# Patient Record
Sex: Female | Born: 1955 | Race: White | Hispanic: No | Marital: Single | Smoking: Current every day smoker
Health system: Southern US, Community
[De-identification: ages and names within clinical notes are randomized; demographics above are authoritative.]

## PROBLEM LIST (undated history)

## (undated) DIAGNOSIS — F410 Panic disorder [episodic paroxysmal anxiety] without agoraphobia: Secondary | ICD-10-CM

## (undated) DIAGNOSIS — D509 Iron deficiency anemia, unspecified: Secondary | ICD-10-CM

## (undated) DIAGNOSIS — E785 Hyperlipidemia, unspecified: Secondary | ICD-10-CM

## (undated) DIAGNOSIS — K219 Gastro-esophageal reflux disease without esophagitis: Secondary | ICD-10-CM

## (undated) DIAGNOSIS — I10 Essential (primary) hypertension: Secondary | ICD-10-CM

## (undated) DIAGNOSIS — K589 Irritable bowel syndrome without diarrhea: Secondary | ICD-10-CM

## (undated) HISTORY — DX: Essential (primary) hypertension: I10

## (undated) HISTORY — DX: Hyperlipidemia, unspecified: E78.5

## (undated) HISTORY — DX: Panic disorder (episodic paroxysmal anxiety): F41.0

## (undated) HISTORY — DX: Gastro-esophageal reflux disease without esophagitis: K21.9

## (undated) HISTORY — PX: BREAST SURGERY: SHX581

## (undated) HISTORY — DX: Iron deficiency anemia, unspecified: D50.9

## (undated) HISTORY — PX: BREAST EXCISIONAL BIOPSY: SUR124

## (undated) HISTORY — DX: Irritable bowel syndrome, unspecified: K58.9

---

## 1998-06-19 ENCOUNTER — Other Ambulatory Visit: Admission: RE | Admit: 1998-06-19 | Discharge: 1998-06-19 | Payer: Self-pay | Admitting: *Deleted

## 1999-04-29 HISTORY — PX: COLONOSCOPY: SHX174

## 1999-10-08 ENCOUNTER — Other Ambulatory Visit: Admission: RE | Admit: 1999-10-08 | Discharge: 1999-10-08 | Payer: Self-pay | Admitting: *Deleted

## 2000-10-12 ENCOUNTER — Other Ambulatory Visit: Admission: RE | Admit: 2000-10-12 | Discharge: 2000-10-12 | Payer: Self-pay | Admitting: *Deleted

## 2001-02-07 ENCOUNTER — Encounter: Admission: RE | Admit: 2001-02-07 | Discharge: 2001-02-07 | Payer: Self-pay | Admitting: *Deleted

## 2001-11-02 ENCOUNTER — Other Ambulatory Visit: Admission: RE | Admit: 2001-11-02 | Discharge: 2001-11-02 | Payer: Self-pay | Admitting: *Deleted

## 2001-12-25 ENCOUNTER — Other Ambulatory Visit: Admission: RE | Admit: 2001-12-25 | Discharge: 2001-12-25 | Payer: Self-pay | Admitting: Obstetrics and Gynecology

## 2002-07-26 ENCOUNTER — Encounter: Admission: RE | Admit: 2002-07-26 | Discharge: 2002-07-26 | Payer: Self-pay | Admitting: *Deleted

## 2002-09-10 ENCOUNTER — Other Ambulatory Visit: Admission: RE | Admit: 2002-09-10 | Discharge: 2002-09-10 | Payer: Self-pay | Admitting: *Deleted

## 2003-02-15 ENCOUNTER — Other Ambulatory Visit: Admission: RE | Admit: 2003-02-15 | Discharge: 2003-02-15 | Payer: Self-pay | Admitting: *Deleted

## 2004-09-18 ENCOUNTER — Ambulatory Visit (HOSPITAL_COMMUNITY): Admission: RE | Admit: 2004-09-18 | Discharge: 2004-09-18 | Payer: Self-pay | Admitting: *Deleted

## 2005-10-08 ENCOUNTER — Ambulatory Visit (HOSPITAL_COMMUNITY): Admission: RE | Admit: 2005-10-08 | Discharge: 2005-10-08 | Payer: Self-pay | Admitting: *Deleted

## 2006-07-23 ENCOUNTER — Emergency Department (HOSPITAL_COMMUNITY): Admission: EM | Admit: 2006-07-23 | Discharge: 2006-07-23 | Payer: Self-pay | Admitting: *Deleted

## 2006-11-03 ENCOUNTER — Ambulatory Visit (HOSPITAL_COMMUNITY): Admission: RE | Admit: 2006-11-03 | Discharge: 2006-11-03 | Payer: Self-pay | Admitting: Obstetrics

## 2008-12-24 ENCOUNTER — Encounter (HOSPITAL_COMMUNITY): Admission: RE | Admit: 2008-12-24 | Discharge: 2009-01-14 | Payer: Self-pay | Admitting: Preventative Medicine

## 2009-10-20 ENCOUNTER — Ambulatory Visit (HOSPITAL_COMMUNITY): Admission: RE | Admit: 2009-10-20 | Discharge: 2009-10-20 | Payer: Self-pay | Admitting: Internal Medicine

## 2010-02-11 ENCOUNTER — Encounter
Admission: RE | Admit: 2010-02-11 | Discharge: 2010-02-11 | Payer: Self-pay | Source: Home / Self Care | Attending: Physical Medicine and Rehabilitation | Admitting: Physical Medicine and Rehabilitation

## 2010-10-21 ENCOUNTER — Other Ambulatory Visit (HOSPITAL_COMMUNITY): Payer: Self-pay | Admitting: Obstetrics

## 2010-10-21 DIAGNOSIS — Z1231 Encounter for screening mammogram for malignant neoplasm of breast: Secondary | ICD-10-CM

## 2010-11-02 ENCOUNTER — Ambulatory Visit (HOSPITAL_COMMUNITY): Payer: BC Managed Care – PPO

## 2011-06-18 ENCOUNTER — Encounter: Payer: BC Managed Care – PPO | Admitting: Internal Medicine

## 2011-06-18 DIAGNOSIS — D473 Essential (hemorrhagic) thrombocythemia: Secondary | ICD-10-CM

## 2011-06-18 DIAGNOSIS — D649 Anemia, unspecified: Secondary | ICD-10-CM

## 2011-06-18 DIAGNOSIS — M129 Arthropathy, unspecified: Secondary | ICD-10-CM

## 2011-07-07 IMAGING — CR DG CERVICAL SPINE COMPLETE 4+V
5 series · 5 of 5 positions shown · non-contrast
Comparison: None.

CLINICAL DATA: Neck pain.

CERVICAL SPINE - COMPLETE 4+ VIEW

[w c-spine lat]
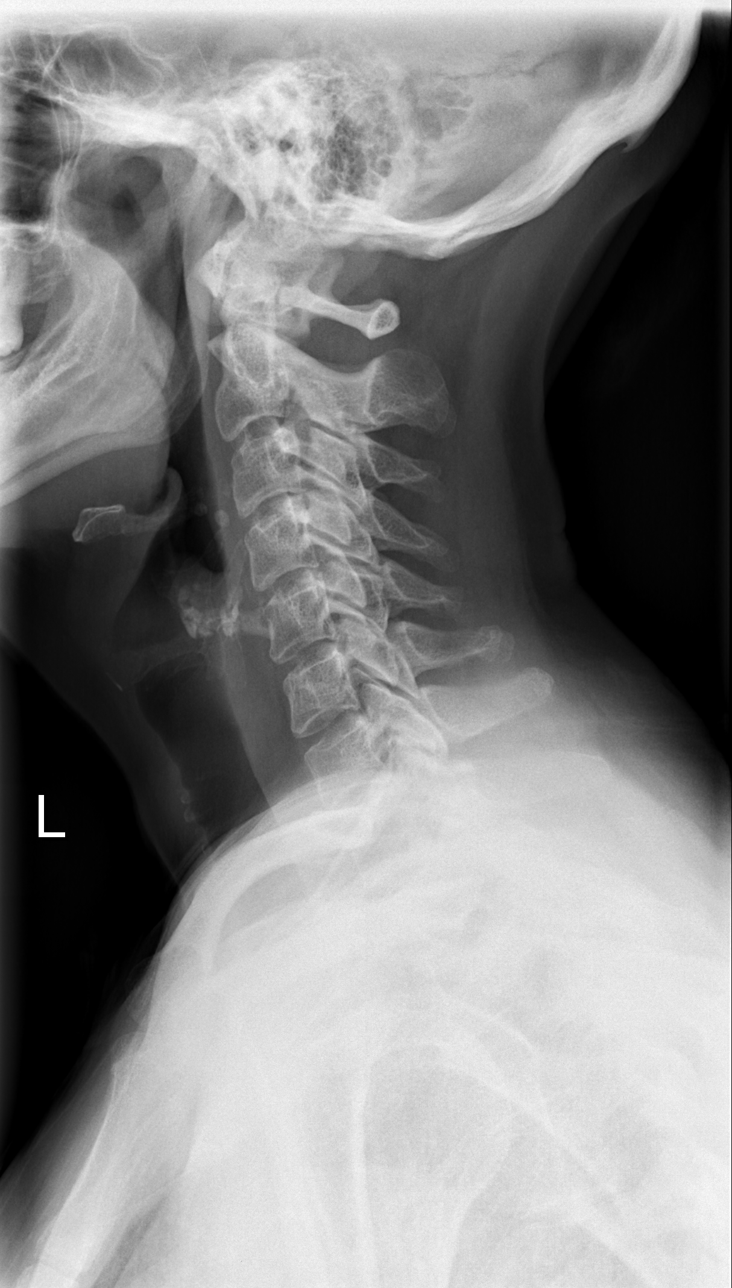

[w c-spine oblique (1 of 2)]
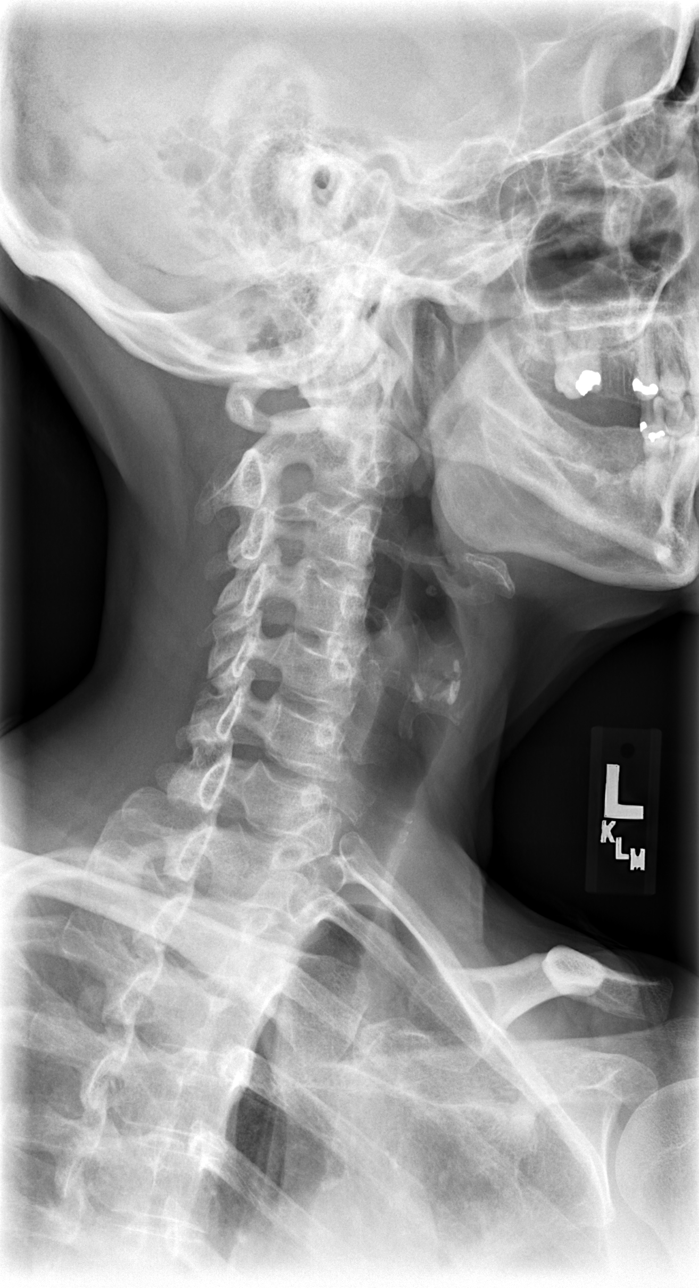

[w c-spine oblique (2 of 2)]
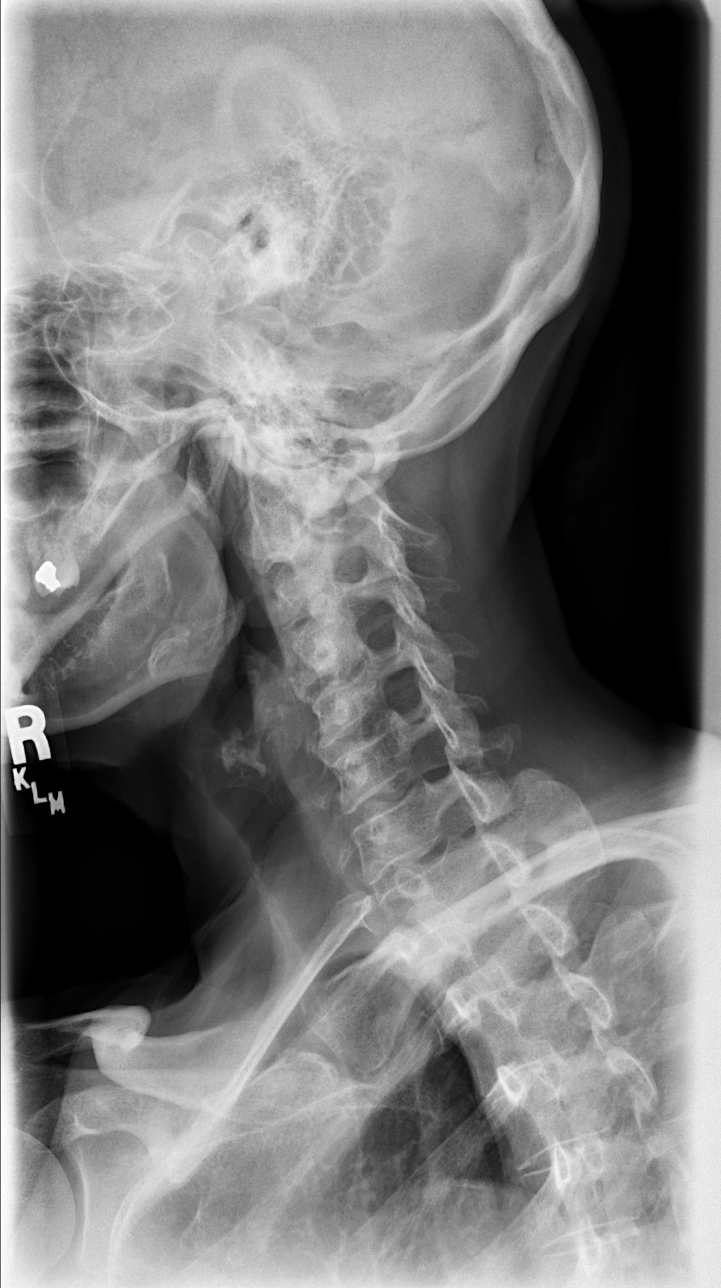

[w c-spine a.p. *]
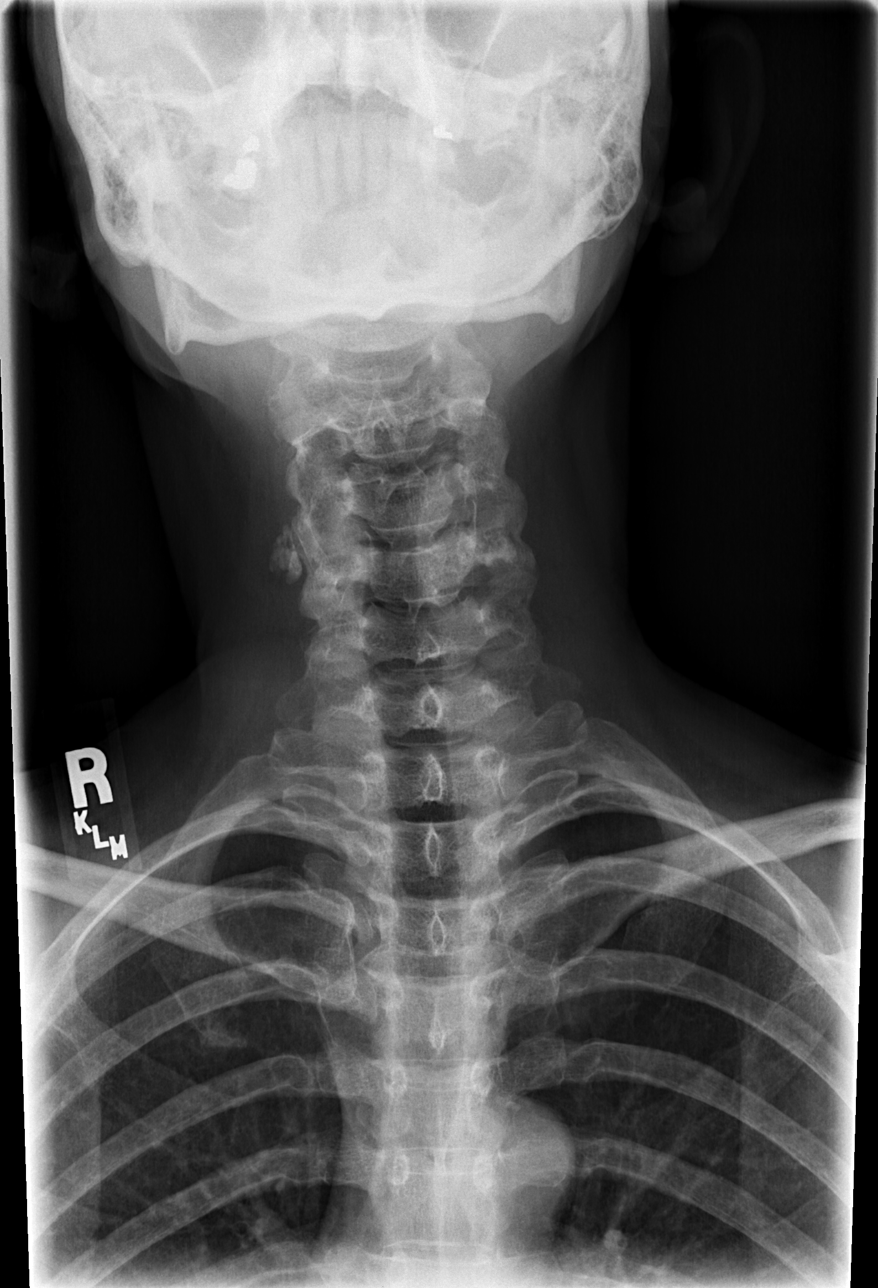

[w c-spine odontoid *]
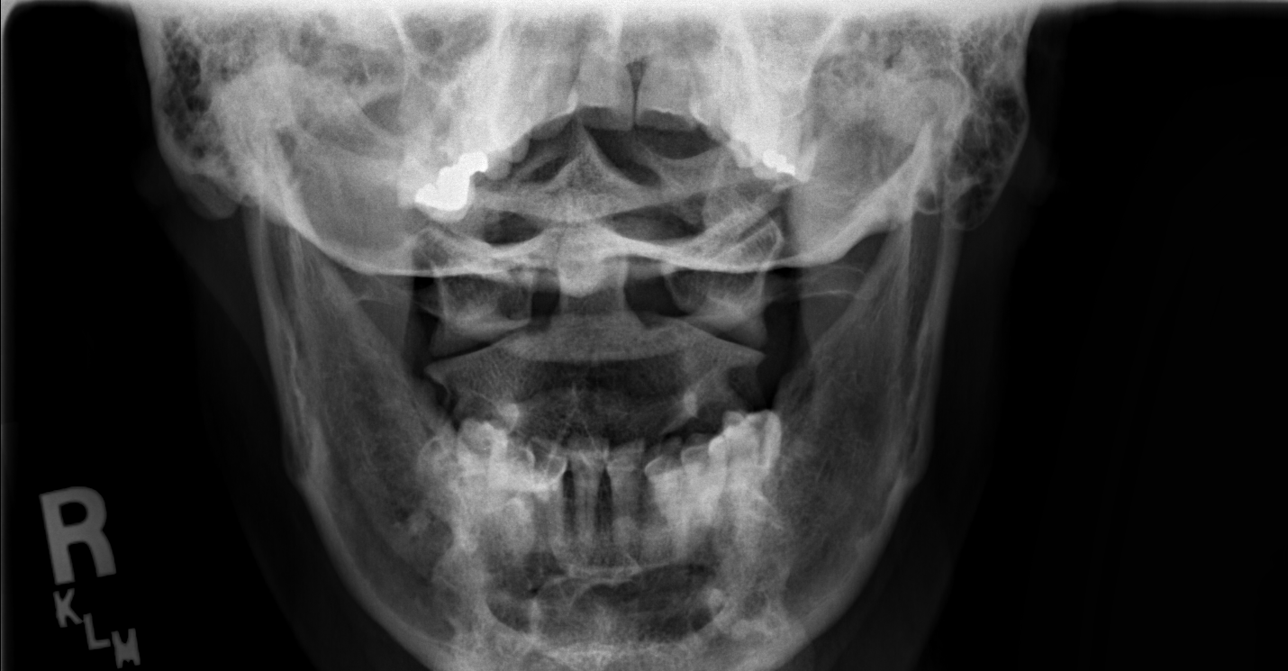

[5 of 5 positions shown; findings below may reference images not displayed]

FINDINGS: No evidence for fracture.  No subluxation.  Mild loss of
disc height is seen at C6-7.  The facets are well-aligned
bilaterally.  There is no bony foraminal encroachment on either
side.  No prevertebral soft tissue swelling.  Prominent right
jugular calcification is evident.
IMPRESSION: Mild loss of disc height at C6-7.  Otherwise unremarkable cervical
spine exam.

## 2011-07-23 ENCOUNTER — Encounter: Payer: BC Managed Care – PPO | Admitting: Internal Medicine

## 2011-07-23 DIAGNOSIS — R5383 Other fatigue: Secondary | ICD-10-CM

## 2011-07-23 DIAGNOSIS — D473 Essential (hemorrhagic) thrombocythemia: Secondary | ICD-10-CM

## 2011-07-23 DIAGNOSIS — R5381 Other malaise: Secondary | ICD-10-CM

## 2011-10-23 ENCOUNTER — Emergency Department (HOSPITAL_COMMUNITY)
Admission: EM | Admit: 2011-10-23 | Discharge: 2011-10-23 | Disposition: A | Discharge status: Home / Self Care | Payer: BC Managed Care – PPO | Attending: Emergency Medicine | Admitting: Emergency Medicine

## 2011-10-23 ENCOUNTER — Emergency Department (HOSPITAL_COMMUNITY): Payer: BC Managed Care – PPO

## 2011-10-23 ENCOUNTER — Encounter (HOSPITAL_COMMUNITY): Payer: Self-pay | Admitting: *Deleted

## 2011-10-23 DIAGNOSIS — R197 Diarrhea, unspecified: Secondary | ICD-10-CM | POA: Insufficient documentation

## 2011-10-23 DIAGNOSIS — R110 Nausea: Secondary | ICD-10-CM | POA: Insufficient documentation

## 2011-10-23 DIAGNOSIS — R059 Cough, unspecified: Secondary | ICD-10-CM | POA: Insufficient documentation

## 2011-10-23 DIAGNOSIS — Z79899 Other long term (current) drug therapy: Secondary | ICD-10-CM | POA: Insufficient documentation

## 2011-10-23 DIAGNOSIS — R5383 Other fatigue: Secondary | ICD-10-CM | POA: Insufficient documentation

## 2011-10-23 DIAGNOSIS — R51 Headache: Secondary | ICD-10-CM | POA: Insufficient documentation

## 2011-10-23 DIAGNOSIS — J3489 Other specified disorders of nose and nasal sinuses: Secondary | ICD-10-CM | POA: Insufficient documentation

## 2011-10-23 DIAGNOSIS — I10 Essential (primary) hypertension: Secondary | ICD-10-CM | POA: Insufficient documentation

## 2011-10-23 DIAGNOSIS — R05 Cough: Secondary | ICD-10-CM | POA: Insufficient documentation

## 2011-10-23 DIAGNOSIS — R5381 Other malaise: Secondary | ICD-10-CM | POA: Insufficient documentation

## 2011-10-23 HISTORY — DX: Essential (primary) hypertension: I10

## 2011-10-23 MED ORDER — HYDROCODONE-ACETAMINOPHEN 5-500 MG PO TABS: 1.0000 | ORAL_TABLET | Freq: Four times a day (QID) | ORAL | Status: DC | PRN

## 2011-10-23 MED ORDER — KETOROLAC TROMETHAMINE 60 MG/2ML IM SOLN
60.0000 mg | Freq: Once | INTRAMUSCULAR | Status: AC
  Administered 2011-10-23: 60 mg via INTRAMUSCULAR
  Filled 2011-10-23: qty 2

## 2011-10-23 MED ORDER — HYDROCODONE-ACETAMINOPHEN 5-325 MG PO TABS
2.0000 | ORAL_TABLET | Freq: Once | ORAL | Status: AC
  Administered 2011-10-23: 2 via ORAL
  Filled 2011-10-23: qty 2

## 2011-10-23 NOTE — ED Notes (Signed)
Pt discharged. Pt stable at time of discharge. Medications reviewed pt has no questions regarding discharge at this time. Pt voiced understanding of discharge instructions.  

## 2011-10-23 NOTE — ED Provider Notes (Signed)
History   This chart was scribed for Pam Lyons, MD scribed by Magnus Sinning. The patient was seen in room APA18/APA18 at 21:19.   CSN: [id number]  Arrival date & time 10/23/11  2042   Chief Complaint  Patient presents with  . Nasal Congestion  . Weakness  . Diarrhea  . Headache  . Nausea    (Consider location/radiation/quality/duration/timing/severity/associated sxs/prior treatment) The history is provided by the patient. No language interpreter was used.   ROE KOFFMAN is a 56 y.o. female who presents to the Emergency Department complaining of gradually improving HA, onset this morning with associated weakness, mild cough, congestion, nausea, and clammy skin. Patient says she woke up this morning with a severe HA. She says it has since gradually improved throughout the day, but explains it still persists. She says she was treated last week for a sinus infection, but she says it has since resolved. She denies fever,neck pain, head injury, pulmonary conditions, cardiac conditions, or abd surgeries. Patient currently taking Oxycodone for treatment of back pain and medications for GERD.   Past Medical History  Diagnosis Date  . Hypertension     Past Surgical History  Procedure Date  . Breast surgery     biopsy    Family History  Problem Relation Age of Onset  . Cancer Mother   . Cancer Father   . Hypertension Father   . Cancer Sister     History  Substance Use Topics  . Smoking status: Current Every Day Smoker  . Smokeless tobacco: Not on file  . Alcohol Use: Yes     occ   Review of Systems 10 Systems reviewed and are negative for acute change except as noted in the HPI. Allergies  Review of patient's allergies indicates no known allergies.  Home Medications   Current Outpatient Rx  Name Route Sig Dispense Refill  . ESOMEPRAZOLE MAGNESIUM 20 MG PO CPDR Oral Take 20 mg by mouth daily before breakfast.      BP 164/103  Pulse 111  Temp 97.9 F (36.6 C)  (Oral)  Resp 20  Ht 5\' 4"  (1.626 m)  Wt 110 lb (49.896 kg)  BMI 18.88 kg/m2  SpO2 100%  Physical Exam  Nursing note and vitals reviewed. Constitutional: She is oriented to person, place, and time. She appears well-developed and well-nourished. No distress.  HENT:  Head: Normocephalic and atraumatic.  Eyes: Conjunctivae normal and EOM are normal. Pupils are equal, round, and reactive to light.  Neck: Normal range of motion. Neck supple. No tracheal deviation present.  Cardiovascular: Normal rate and regular rhythm.  Exam reveals no friction rub.   No murmur heard. Pulmonary/Chest: Effort normal. No respiratory distress.  Abdominal: Soft. She exhibits no distension. There is no tenderness.  Musculoskeletal: Normal range of motion. She exhibits no edema.  Lymphadenopathy:    She has no cervical adenopathy.  Neurological: She is alert and oriented to person, place, and time. No cranial nerve deficit or sensory deficit.  Skin: Skin is warm and dry.  Psychiatric: She has a normal mood and affect. Her behavior is normal.    ED Course  Procedures (including critical care time) DIAGNOSTIC STUDIES: Oxygen Saturation is 100% on room air, normal by my interpretation.    COORDINATION OF CARE: 21:21: Physical exam performed.  Labs Reviewed - No data to display No results found.   No diagnosis found.    MDM  The patient presents here with multiple symptoms that all seem to have started  over the past few days.  These include congestion, diarrhea, weakness, and today began with headache that kept her from going to work.  I am uncertain as to how these symptoms are related, but I suspect there is likely a viral etiology.  A ct of the head was performed which was negative.  At this point, she remains neurologically intact and is feeling better with the medications she was given.  I see no indication for an LP or further workup.  She will be discharged to home.   I personally performed the  services described in this documentation, which was scribed in my presence. The recorded information has been reviewed and considered.           Pam Lyons, MD 10/23/11 [phone]

## 2011-10-23 NOTE — ED Notes (Signed)
Pt c/o nausea, congestion, headache, weakness, and diarrhea. Pt recently treated for sinus infection with a z pack.

## 2012-12-07 ENCOUNTER — Telehealth: Payer: Self-pay

## 2012-12-13 NOTE — Telephone Encounter (Signed)
LMOM to call . Her last one was 04/29/1999 by Dr. Jena Gauss at [facility] and she had a normal colon.

## 2012-12-28 NOTE — Telephone Encounter (Signed)
Pt has OV with Tana Coast, PA on 01/09/2013 at 9:30 AM for diarrhea and schedule colonoscopy.

## 2013-01-09 ENCOUNTER — Ambulatory Visit: Payer: BC Managed Care – PPO | Admitting: Gastroenterology

## 2013-02-07 ENCOUNTER — Other Ambulatory Visit: Payer: Self-pay | Admitting: Internal Medicine

## 2013-02-07 ENCOUNTER — Ambulatory Visit (INDEPENDENT_AMBULATORY_CARE_PROVIDER_SITE_OTHER): Payer: PRIVATE HEALTH INSURANCE | Admitting: Gastroenterology

## 2013-02-07 ENCOUNTER — Encounter (INDEPENDENT_AMBULATORY_CARE_PROVIDER_SITE_OTHER): Payer: Self-pay

## 2013-02-07 ENCOUNTER — Encounter: Payer: Self-pay | Admitting: Gastroenterology

## 2013-02-07 VITALS — BP 159/91 | HR 88 | Temp 98.3°F | Wt 106.2 lb

## 2013-02-07 DIAGNOSIS — R634 Abnormal weight loss: Secondary | ICD-10-CM

## 2013-02-07 DIAGNOSIS — R197 Diarrhea, unspecified: Secondary | ICD-10-CM

## 2013-02-07 DIAGNOSIS — K529 Noninfective gastroenteritis and colitis, unspecified: Secondary | ICD-10-CM

## 2013-02-07 MED ORDER — PEG 3350-KCL-NA BICARB-NACL 420 G PO SOLR: 4000.0000 mL | ORAL | Status: DC

## 2013-02-07 NOTE — Patient Instructions (Addendum)
1. Colonoscopy as planned. See separate instructions.  2. You may try imodium 2mg  every morning. Increase up to three times daily if needed. 3. Due to your weight loss, let's go ahead and check labs for celiac and stool specimen to see if you pancreas is working properly.

## 2013-02-07 NOTE — Assessment & Plan Note (Signed)
58 year old lady with one-year history of chronic diarrhea, 10+ pound weight loss. Differential diagnosis includes irritable bowel syndrome, microscopic colitis, celiac disease, pancreatic insufficiency. Doubt we are dealing with IBD. Recommend colonoscopy for further evaluation. Given history of an inadequate conscious sedation, I offered her each sedation in the OR but she preferred not to go this route. We will augment conscious sedation with Phenergan 25 mg IV 30 minutes before the procedure.  I have discussed the risks, alternatives, benefits with regards to but not limited to the risk of reaction to medication, bleeding, infection, perforation and the patient is agreeable to proceed. Written consent to be obtained.  Trial of Imodium 2 mg daily. May take up to 3 times daily as needed. Check celiac serologies. Stool elastase.

## 2013-02-07 NOTE — Progress Notes (Signed)
Primary Care Physician:  Glenda Chroman., MD Primary Gastroenterologist:  Garfield Cornea, MD  Chief Complaint  Patient presents with  . Colonoscopy    HPI:  Pam Lopez is a 58 y.o. female here to schedule colonoscopy. Last colonoscopy was back in 2001. She had minimal internal hemorrhoids but otherwise unremarkable study. Patient complains of one year history of postprandial loose stool with fecal urgency.  Especially bad after eating out. Avoiding food. Occasionally brbpr. Abdominal cramping, relieved after bowel movement. Weight down from 115-120 one year ago. Stable for past six months. Intermittent heartburn, takes Prilosec or Zantac as needed. Does not take regularly. Denies dysphagia, vomiting. Complains of bloating and intermittent hard stools as well. Given been told recently, one daily made her constipated.  Chronic pain medications for couple of years. For chronic back pain, joint pain.   Recalls being awake during her last colonoscopy. Given Versed 2 mg, Demerol 50 mg.   Current Outpatient Prescriptions  Medication Sig Dispense Refill  . Oxycodone HCl 10 MG TABS Take 10 mg by mouth every 6 (six) hours as needed. pain       No current facility-administered medications for this visit.    Allergies as of 02/07/2013  . (No Known Allergies)    Past Medical History  Diagnosis Date  . Hypertension   . Panic disorder   . HTN (hypertension)     not on medication  . Dyslipidemia     Past Surgical History  Procedure Laterality Date  . Breast surgery      biopsy  . Colonoscopy  04/29/99    BZJ:IRCVELF internal hemorrhoids/normal colon,rectum,and terinal ileum    Family History  Problem Relation Age of Onset  . Cancer Mother     stomach  . Cancer Father     lung  . Hypertension Father   . Cancer Sister     breast  . Cancer Sister     female    History   Social History  . Marital Status: Single    Spouse Name: N/A    Number of Children: N/A  . 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 Smoking status: Current Every Day Smoker  . Smokeless tobacco: Not on file  . Alcohol Use: Yes     Comment: occ  . Drug Use: No  . Sexual Activity:    Other Topics Concern  . Not on file   Social History Narrative  . No narrative on file      ROS:  General: Negative for anorexia,  fever, chills, fatigue, weakness. See history of present illness Eyes: Negative for vision changes.  ENT: Negative for hoarseness, difficulty swallowing , nasal congestion. CV: Negative for chest pain, angina, palpitations, dyspnea on exertion, peripheral edema.  Respiratory: Negative for dyspnea at rest, dyspnea on exertion, cough, sputum, wheezing.  GI: See history of present illness. GU:  Negative for dysuria, hematuria, urinary incontinence, urinary frequency, nocturnal urination.  MS:  Chronic back and joint pain Derm: Negative for rash or itching.  Neuro: Negative for weakness, abnormal sensation, seizure, frequent headaches, memory loss, confusion.  Psych: Negative for anxiety, depression, suicidal ideation, hallucinations.  Endo:  See history of present illness Heme: Negative for bruising or bleeding. Allergy: Negative for rash or hives.    Physical Examination:  BP 159/91  Pulse 88  Temp(Src) 98.3 F (36.8 C) (Oral)  Wt 106 lb 3.2 oz (48.172 kg)  General: Well-nourished, well-developed in no acute distress.  Head: Normocephalic, atraumatic.   Eyes: Conjunctiva pink, no icterus. Mouth: Oropharyngeal mucosa moist and pink , no lesions erythema or exudate. Neck: Supple without thyromegaly, masses, or lymphadenopathy.  Lungs: Clear to auscultation bilaterally.  Heart: Regular rate and rhythm, no murmurs rubs or gallops.  Abdomen: Bowel sounds are normal, nontender, nondistended, no hepatosplenomegaly or masses, no abdominal bruits or    hernia , no rebound or guarding.   Rectal: Not  performed Extremities: No lower extremity edema. No clubbing or deformities.  Neuro: Alert and oriented x 4 , grossly normal neurologically.  Skin: Warm and dry, no rash or jaundice.   Psych: Alert and cooperative, normal mood and affect.  Labs: Labs from November 2014. White blood cell count 13,100, hemoglobin 14.1, hematocrit 41.9, platelets were 461,000, TSH 0.713, glucose 83, BUN 9, creatinine 0.68, total bilirubin 0.5, alkaline phosphatase 47, AST 19, ALT 13, albumin 4.5, total cholesterol 270, HDL 102, LDL 131, triglycerides 184  Imaging Studies: No results found.

## 2013-02-08 ENCOUNTER — Other Ambulatory Visit: Payer: Self-pay | Admitting: Gastroenterology

## 2013-02-08 LAB — TISSUE TRANSGLUTAMINASE, IGA: TISSUE TRANSGLUTAMINASE AB, IGA: 5.2 U/mL (ref <20)

## 2013-02-08 LAB — IGA: IgA: 111 mg/dL (ref 69–380)

## 2013-02-08 NOTE — Progress Notes (Signed)
cc'd to pcp 

## 2013-02-13 LAB — TSH
ALBUMIN: 4.5
ALT: 13 U/L (ref 7–35)
AST: 19 U/L
Alkaline Phosphatase: 47 U/L
BUN: 9 mg/dL (ref 4–21)
CHOLESTEROL: 279 mg/dL — AB (ref 0–200)
CREATININE: 0.68
Calcium: 10.1 mg/dL
GLUCOSE: 83
HDL: 102 mg/dL — AB (ref 35–70)
TSH: 0.71 u[IU]/mL (ref 0.41–5.90)
Total Bilirubin: 0.5 mg/dL
Triglycerides: 184
VLDL CHOLESTEROL CAL: 37

## 2013-02-13 LAB — CBC
HCT: 42 %
HGB: 14.1 g/dL
MCV: 91 fL
PLATELET COUNT: 461
WBC: 13.1

## 2013-02-15 ENCOUNTER — Encounter (HOSPITAL_COMMUNITY): Payer: Self-pay | Admitting: *Deleted

## 2013-02-15 ENCOUNTER — Encounter (HOSPITAL_COMMUNITY)
Admission: RE | Disposition: A | Discharge status: Home / Self Care | Payer: Self-pay | Source: Ambulatory Visit | Attending: Internal Medicine

## 2013-02-15 ENCOUNTER — Ambulatory Visit (HOSPITAL_COMMUNITY)
Admission: RE | Admit: 2013-02-15 | Discharge: 2013-02-15 | Disposition: A | Discharge status: Home / Self Care | Payer: 59 | Source: Ambulatory Visit | Attending: Internal Medicine | Admitting: Internal Medicine

## 2013-02-15 DIAGNOSIS — I10 Essential (primary) hypertension: Secondary | ICD-10-CM | POA: Insufficient documentation

## 2013-02-15 DIAGNOSIS — K529 Noninfective gastroenteritis and colitis, unspecified: Secondary | ICD-10-CM

## 2013-02-15 DIAGNOSIS — R634 Abnormal weight loss: Secondary | ICD-10-CM

## 2013-02-15 DIAGNOSIS — D126 Benign neoplasm of colon, unspecified: Secondary | ICD-10-CM | POA: Insufficient documentation

## 2013-02-15 DIAGNOSIS — R197 Diarrhea, unspecified: Secondary | ICD-10-CM

## 2013-02-15 DIAGNOSIS — Z79899 Other long term (current) drug therapy: Secondary | ICD-10-CM | POA: Insufficient documentation

## 2013-02-15 DIAGNOSIS — E785 Hyperlipidemia, unspecified: Secondary | ICD-10-CM | POA: Insufficient documentation

## 2013-02-15 HISTORY — PX: COLONOSCOPY: SHX5424

## 2013-02-15 SURGERY — COLONOSCOPY
Anesthesia: Moderate Sedation

## 2013-02-15 MED ORDER — ONDANSETRON HCL 4 MG/2ML IJ SOLN
INTRAMUSCULAR | Status: AC
  Filled 2013-02-15: qty 2

## 2013-02-15 MED ORDER — MEPERIDINE HCL 100 MG/ML IJ SOLN
INTRAMUSCULAR | Status: DC | PRN
  Administered 2013-02-15: 50 mg via INTRAVENOUS
  Administered 2013-02-15: 25 mg via INTRAVENOUS

## 2013-02-15 MED ORDER — SODIUM CHLORIDE 0.9 % IJ SOLN
INTRAMUSCULAR | Status: AC
  Filled 2013-02-15: qty 10

## 2013-02-15 MED ORDER — STERILE WATER FOR IRRIGATION IR SOLN
Status: DC | PRN
  Administered 2013-02-15: 12:00:00

## 2013-02-15 MED ORDER — SODIUM CHLORIDE 0.9 % IV SOLN
INTRAVENOUS | Status: DC
  Administered 2013-02-15: 11:00:00 via INTRAVENOUS

## 2013-02-15 MED ORDER — PROMETHAZINE HCL 25 MG/ML IJ SOLN
25.0000 mg | Freq: Once | INTRAMUSCULAR | Status: AC
  Administered 2013-02-15: 25 mg via INTRAVENOUS

## 2013-02-15 MED ORDER — MIDAZOLAM HCL 5 MG/5ML IJ SOLN
INTRAMUSCULAR | Status: AC
  Filled 2013-02-15: qty 10

## 2013-02-15 MED ORDER — MEPERIDINE HCL 100 MG/ML IJ SOLN
INTRAMUSCULAR | Status: AC
  Filled 2013-02-15: qty 2

## 2013-02-15 MED ORDER — PROMETHAZINE HCL 25 MG/ML IJ SOLN
INTRAMUSCULAR | Status: AC
  Filled 2013-02-15: qty 1

## 2013-02-15 MED ORDER — ONDANSETRON HCL 4 MG/2ML IJ SOLN
INTRAMUSCULAR | Status: DC | PRN
  Administered 2013-02-15: 4 mg via INTRAVENOUS

## 2013-02-15 MED ORDER — MIDAZOLAM HCL 5 MG/5ML IJ SOLN
INTRAMUSCULAR | Status: DC | PRN
  Administered 2013-02-15 (×2): 2 mg via INTRAVENOUS
  Administered 2013-02-15: 1 mg via INTRAVENOUS

## 2013-02-15 NOTE — H&P (View-Only) (Signed)
Primary Care Physician:  Glenda Chroman., MD Primary Gastroenterologist:  Garfield Cornea, MD  Chief Complaint  Patient presents with  . Colonoscopy    HPI:  Pam Lopez is a 58 y.o. female here to schedule colonoscopy. Last colonoscopy was back in 2001. She had minimal internal hemorrhoids but otherwise unremarkable study. Patient complains of one year history of postprandial loose stool with fecal urgency.  Especially bad after eating out. Avoiding food. Occasionally brbpr. Abdominal cramping, relieved after bowel movement. Weight down from 115-120 one year ago. Stable for past six months. Intermittent heartburn, takes Prilosec or Zantac as needed. Does not take regularly. Denies dysphagia, vomiting. Complains of bloating and intermittent hard stools as well. Given been told recently, one daily made her constipated.  Chronic pain medications for couple of years. For chronic back pain, joint pain.   Recalls being awake during her last colonoscopy. Given Versed 2 mg, Demerol 50 mg.   Current Outpatient Prescriptions  Medication Sig Dispense Refill  . Oxycodone HCl 10 MG TABS Take 10 mg by mouth every 6 (six) hours as needed. pain       No current facility-administered medications for this visit.    Allergies as of 02/07/2013  . (No Known Allergies)    Past Medical History  Diagnosis Date  . Hypertension   . Panic disorder   . HTN (hypertension)     not on medication  . Dyslipidemia     Past Surgical History  Procedure Laterality Date  . Breast surgery      biopsy  . Colonoscopy  04/29/99    BZJ:IRCVELF internal hemorrhoids/normal colon,rectum,and terinal ileum    Family History  Problem Relation Age of Onset  . Cancer Mother     stomach  . Cancer Father     lung  . Hypertension Father   . Cancer Sister     breast  . Cancer Sister     female    History   Social History  . Marital Status: Single    Spouse Name: N/A    Number of Children: N/A  . 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 Smoking status: Current Every Day Smoker  . Smokeless tobacco: Not on file  . Alcohol Use: Yes     Comment: occ  . Drug Use: No  . Sexual Activity:    Other Topics Concern  . Not on file   Social History Narrative  . No narrative on file      ROS:  General: Negative for anorexia,  fever, chills, fatigue, weakness. See history of present illness Eyes: Negative for vision changes.  ENT: Negative for hoarseness, difficulty swallowing , nasal congestion. CV: Negative for chest pain, angina, palpitations, dyspnea on exertion, peripheral edema.  Respiratory: Negative for dyspnea at rest, dyspnea on exertion, cough, sputum, wheezing.  GI: See history of present illness. GU:  Negative for dysuria, hematuria, urinary incontinence, urinary frequency, nocturnal urination.  MS:  Chronic back and joint pain Derm: Negative for rash or itching.  Neuro: Negative for weakness, abnormal sensation, seizure, frequent headaches, memory loss, confusion.  Psych: Negative for anxiety, depression, suicidal ideation, hallucinations.  Endo:  See history of present illness Heme: Negative for bruising or bleeding. Allergy: Negative for rash or hives.    Physical Examination:  BP 159/91  Pulse 88  Temp(Src) 98.3 F (36.8 C) (Oral)  Wt 106 lb 3.2 oz (48.172 kg)  General: Well-nourished, well-developed in no acute distress.  Head: Normocephalic, atraumatic.   Eyes: Conjunctiva pink, no icterus. Mouth: Oropharyngeal mucosa moist and pink , no lesions erythema or exudate. Neck: Supple without thyromegaly, masses, or lymphadenopathy.  Lungs: Clear to auscultation bilaterally.  Heart: Regular rate and rhythm, no murmurs rubs or gallops.  Abdomen: Bowel sounds are normal, nontender, nondistended, no hepatosplenomegaly or masses, no abdominal bruits or    hernia , no rebound or guarding.   Rectal: Not  performed Extremities: No lower extremity edema. No clubbing or deformities.  Neuro: Alert and oriented x 4 , grossly normal neurologically.  Skin: Warm and dry, no rash or jaundice.   Psych: Alert and cooperative, normal mood and affect.  Labs: Labs from November 2014. White blood cell count 13,100, hemoglobin 14.1, hematocrit 41.9, platelets were 461,000, TSH 0.713, glucose 83, BUN 9, creatinine 0.68, total bilirubin 0.5, alkaline phosphatase 47, AST 19, ALT 13, albumin 4.5, total cholesterol 270, HDL 102, LDL 131, triglycerides 184  Imaging Studies: No results found.    

## 2013-02-15 NOTE — Interval H&P Note (Signed)
History and Physical Interval Note:  02/15/2013 12:21 PM  Pam Lopez  has presented today for surgery, with the diagnosis of CHRONIC DIARRHEA  AND WEIGHT LOSS  The various methods of treatment have been discussed with the patient and family. After consideration of risks, benefits and other options for treatment, the patient has consented to  Procedure(s) with comments: COLONOSCOPY (N/A) - 12:50 as a surgical intervention .  The patient's history has been reviewed, patient examined, no change in status, stable for surgery.  I have reviewed the patient's chart and labs.  Questions were answered to the patient's satisfaction.     No change. Celiac screen negative. Colonoscopy per plan.The risks, benefits, limitations, alternatives and imponderables have been reviewed with the patient. Questions have been answered. All parties are agreeable.   Manus Rudd

## 2013-02-15 NOTE — Op Note (Signed)
[place], [address]   COLONOSCOPY PROCEDURE REPORT  PATIENT: Pam Lopez, Pam Lopez  MR#:         [id number] BIRTHDATE: 02/16/1955 , 26  yrs. old GENDER: Female ENDOSCOPIST: R.  Garfield Cornea, MD FACP FACG REFERRED BY:  Jerene Bears, M.D. PROCEDURE DATE:  02/15/2013 PROCEDURE:     Ileocolonoscopy with segmental biopsy and snare polypectomy  INDICATIONS: Chronic diarrhea  INFORMED CONSENT:  The risks, benefits, alternatives and imponderables including but not limited to bleeding, perforation as well as the possibility of a missed lesion have been reviewed.  The potential for biopsy, lesion removal, etc. have also been discussed.  Questions have been answered.  All parties agreeable. Please see the history and physical in the medical record for more information.  MEDICATIONS: Versed 5 mg IV and Demerol 75 mg IV in divided doses. Phenergan 25 mg IV and Zofran 4 mg IV.  DESCRIPTION OF PROCEDURE:  After a digital rectal exam was performed, the EC-3490TLi ([id number]) and EC-3490TLi ([id number]) colonoscope was advanced from the anus through the rectum and colon to the area of the cecum, ileocecal valve and appendiceal orifice. The cecum was deeply intubated.  These structures were well-seen and photographed for the record.  From the level of the cecum and ileocecal valve, the scope was slowly and cautiously withdrawn. The mucosal surfaces were carefully surveyed utilizing scope tip deflection to facilitate fold flattening as needed.  The scope was pulled down into the rectum where a thorough examination including retroflexion was performed.    FINDINGS:  Adequate preparation. Normal rectum. (1) 5 mm polyp in the mid descending segment; otherwise, the remainder of the colonic mucosa appeared normal. The distal 5 cm of terminal ileum because also appeared normal.  THERAPEUTIC / DIAGNOSTIC MANEUVERS PERFORMED:   biopsies of the ascending and descending  segments taken to evaluate for microscopic colitis. D1 above-mentioned polyp was cold snare removed and recovered for the pathologist.  COMPLICATIONS: None  CECAL WITHDRAWAL TIME:  10 minutes  IMPRESSION:  Colonic polyp-removed as described above. Segmental biopsy performed  RECOMMENDATIONS: Followup on pathology. Further recommendations to follow.   _______________________________ eSigned:  R. Garfield Cornea, MD FACP [facility] 02/15/2013 1:08 PM   CC:

## 2013-02-15 NOTE — Discharge Instructions (Signed)

## 2013-02-16 ENCOUNTER — Encounter: Payer: Self-pay | Admitting: Internal Medicine

## 2013-02-16 LAB — PANCREATIC ELASTASE, FECAL: PANCREATIC ELASTASE-1, STL: 390 ug/g

## 2013-02-19 ENCOUNTER — Encounter (HOSPITAL_COMMUNITY): Payer: Self-pay | Admitting: Internal Medicine

## 2013-02-20 ENCOUNTER — Telehealth: Payer: Self-pay

## 2013-02-20 ENCOUNTER — Encounter: Payer: Self-pay | Admitting: Internal Medicine

## 2013-02-20 NOTE — Telephone Encounter (Signed)
Pt is aware of OV on 3/10 at 8 with LSL and appt card was mailed

## 2013-02-20 NOTE — Telephone Encounter (Signed)
Results cc to PCP 

## 2013-02-20 NOTE — Telephone Encounter (Signed)
Letter from: Daneil Dolin  Reason for Letter: Results Review  Send letter to patient.  Send copy of letter with path to referring provider and PCP.   Would all for an office visit with extender with about 4-6 weeks to review of diarrhea if not already done

## 2013-02-20 NOTE — Telephone Encounter (Signed)
Letter mailed to pt.  

## 2013-03-01 ENCOUNTER — Encounter: Payer: Self-pay | Admitting: Internal Medicine

## 2013-03-17 IMAGING — CT CT HEAD W/O CM
1 series · 16 of 30 positions shown, 20 images · non-contrast
Comparison: None.

CLINICAL DATA: Congestion, weakness, dizziness.

CT HEAD WITHOUT CONTRAST
TECHNIQUE: Contiguous axial images were obtained from the base of
the skull through the vertex without contrast.

[Series 2: headseq 4.8 h37s · axial · 0.43mm/px · z∈[+81,+211]mm · 16 of 30 slices shown, 20 images]
[im 2/30  brain]
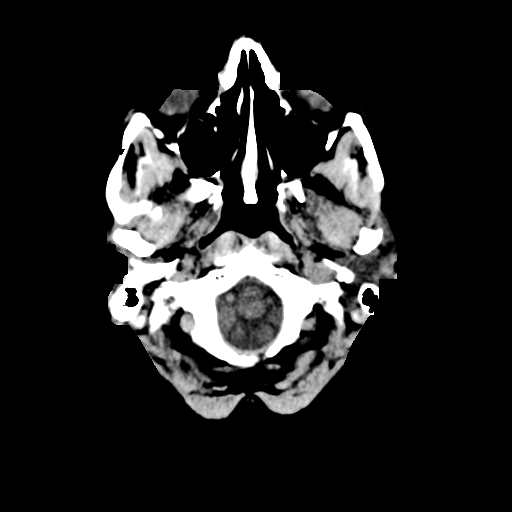
[im 2/30  bone]
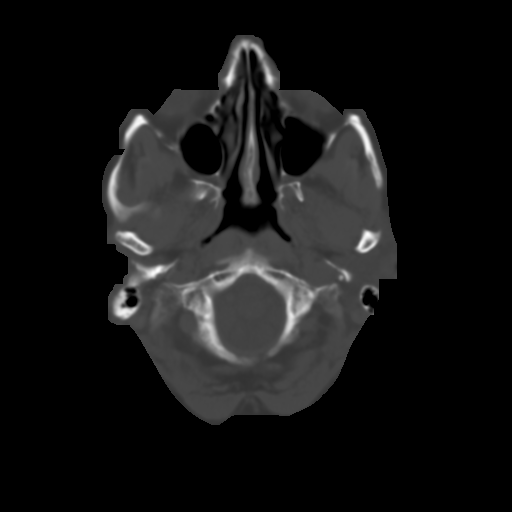
[im 4/30  brain]
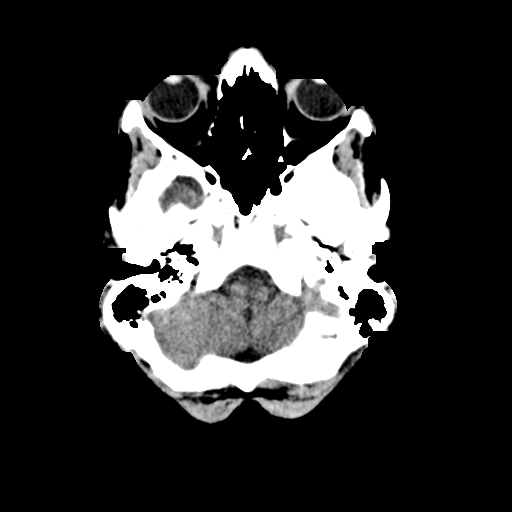
[im 6/30  brain]
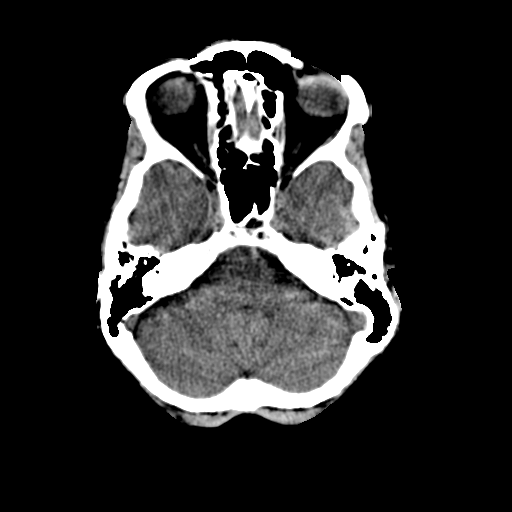
[im 8/30  brain]
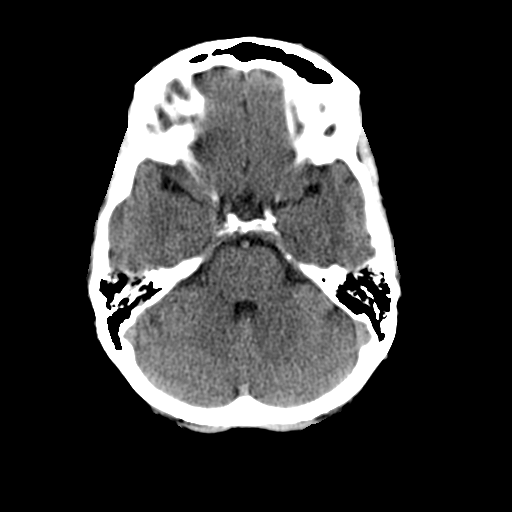
[im 9/30  brain]
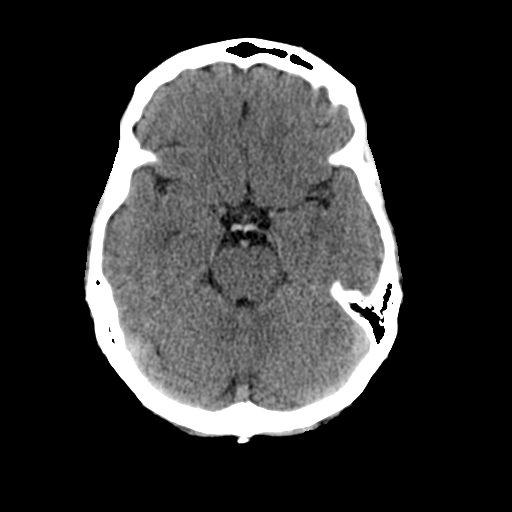
[im 9/30  bone]
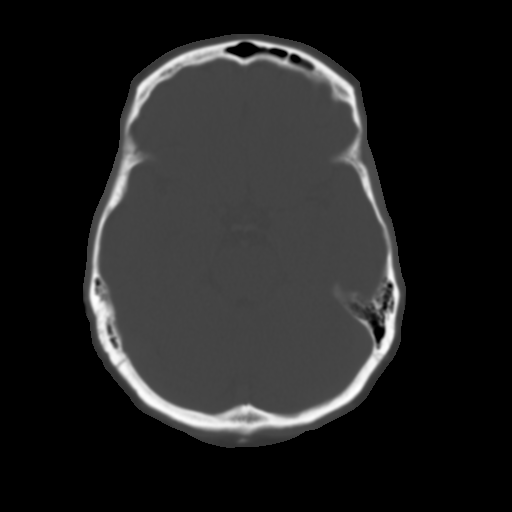
[im 11/30  brain]
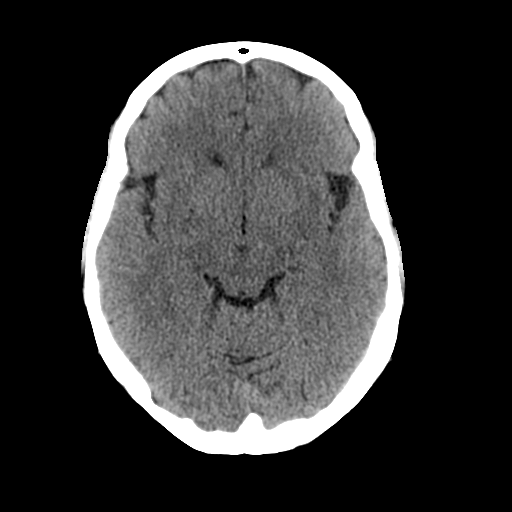
[im 13/30  brain]
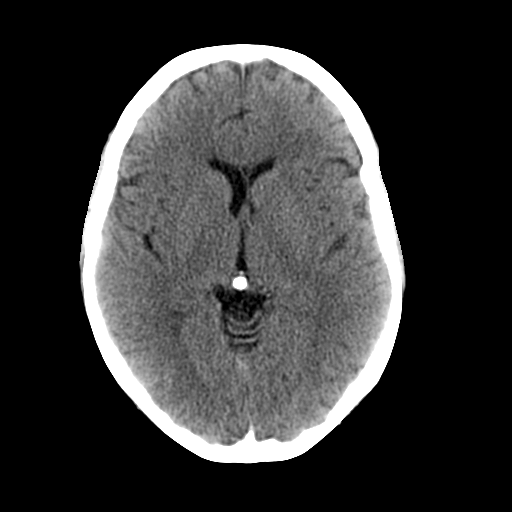
[im 15/30  brain]
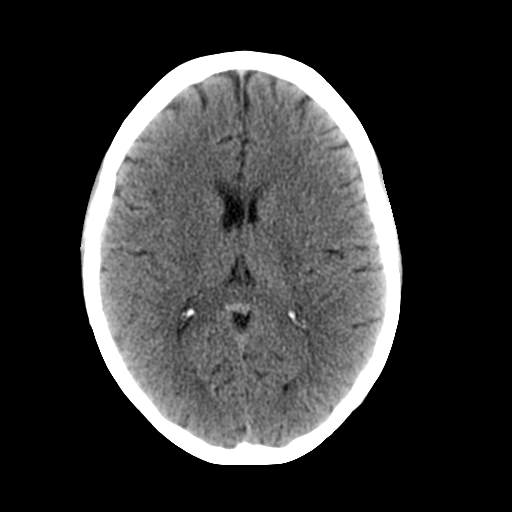
[im 16/30  brain]
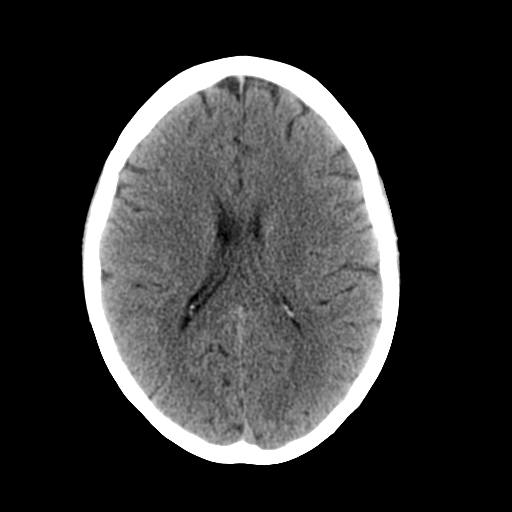
[im 16/30  bone]
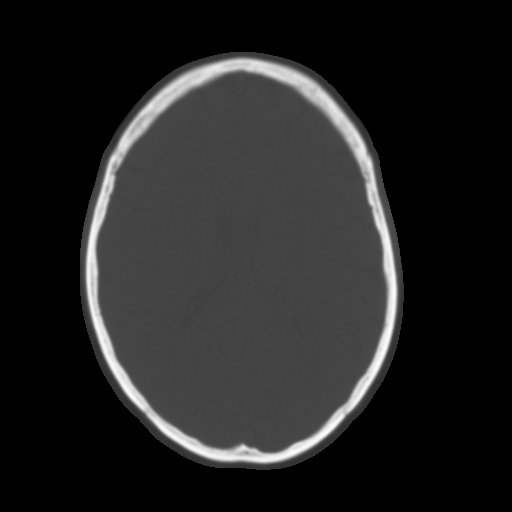
[im 18/30  brain]
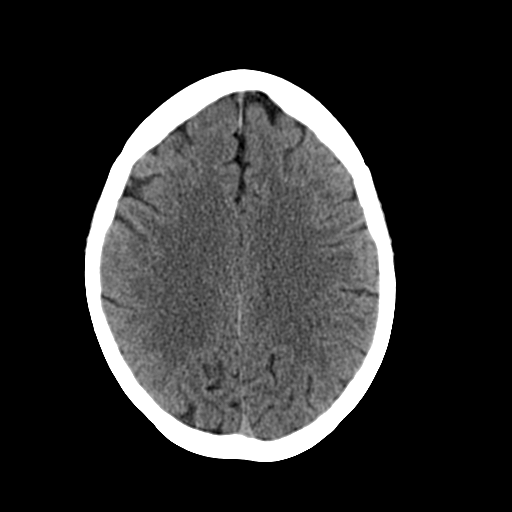
[im 20/30  brain]
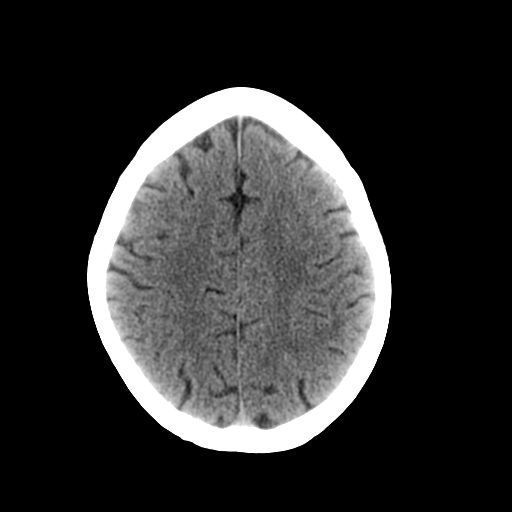
[im 22/30  brain]
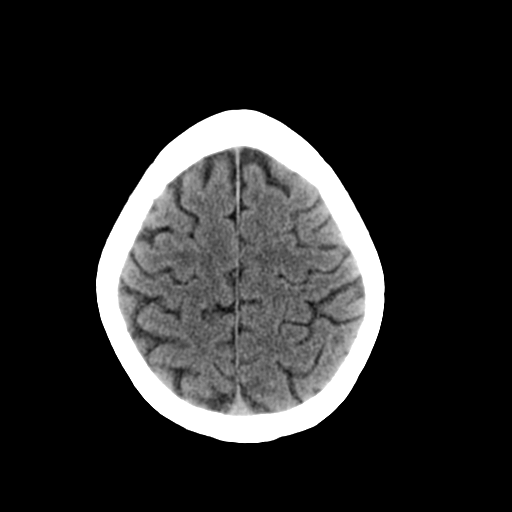
[im 23/30  brain]
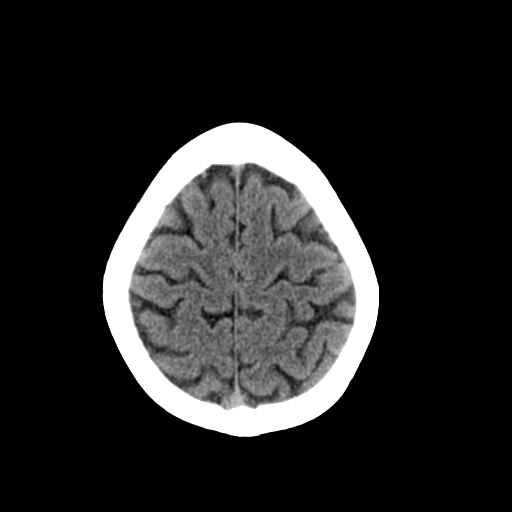
[im 23/30  bone]
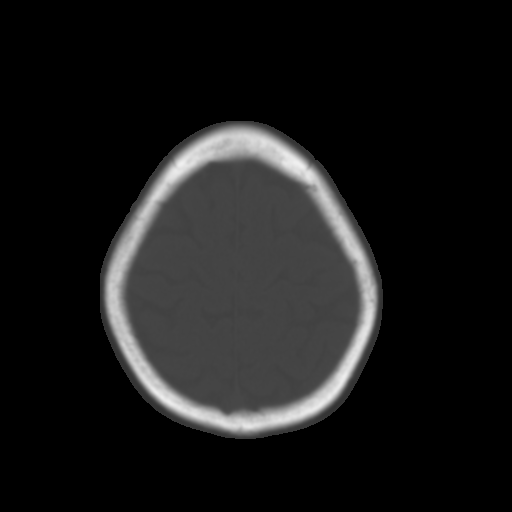
[im 25/30  brain]
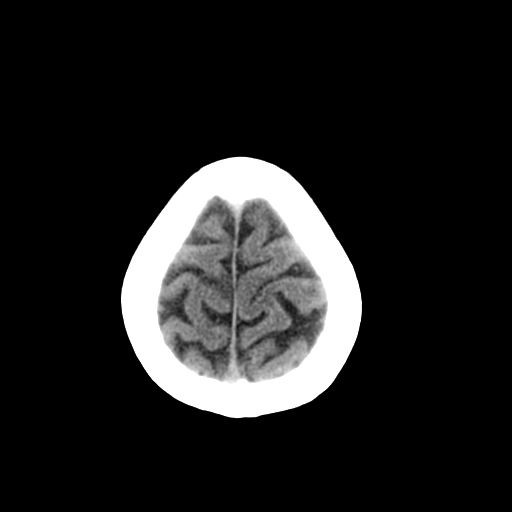
[im 27/30  brain]
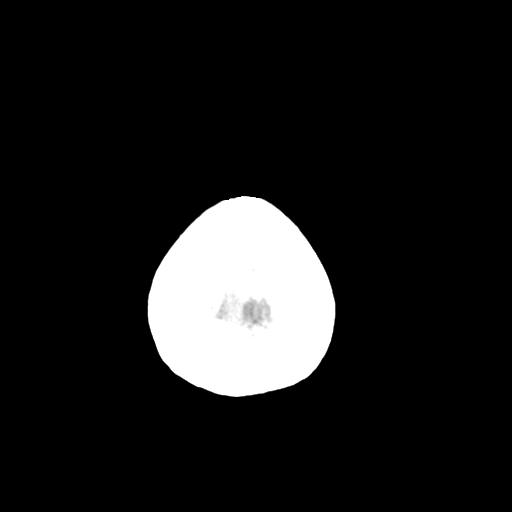
[im 29/30  brain]
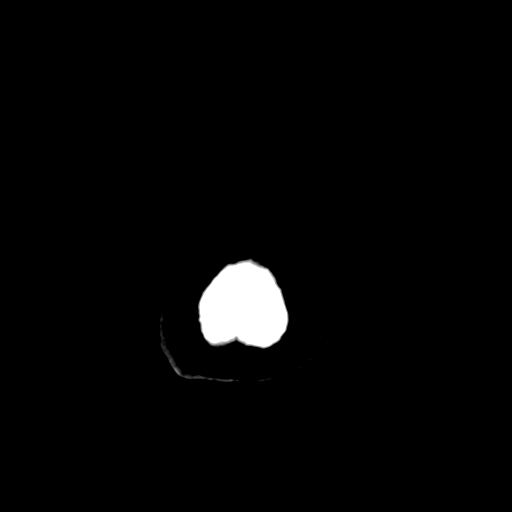

[16 of 30 positions shown; findings below may reference images not displayed]

FINDINGS: No acute intracranial abnormality.  Specifically, no
hemorrhage, hydrocephalus, mass lesion, acute infarction, or
significant intracranial injury.  No acute calvarial abnormality.
Visualized paranasal sinuses and mastoids clear.  Orbital soft
tissues unremarkable.
IMPRESSION: Unremarkable study.

## 2013-03-27 ENCOUNTER — Encounter (INDEPENDENT_AMBULATORY_CARE_PROVIDER_SITE_OTHER): Payer: Self-pay

## 2013-03-27 ENCOUNTER — Ambulatory Visit (INDEPENDENT_AMBULATORY_CARE_PROVIDER_SITE_OTHER): Payer: 59 | Admitting: Gastroenterology

## 2013-03-27 ENCOUNTER — Encounter: Payer: Self-pay | Admitting: Gastroenterology

## 2013-03-27 VITALS — BP 149/96 | HR 75 | Temp 98.1°F | Ht 63.0 in | Wt 106.6 lb

## 2013-03-27 DIAGNOSIS — R1314 Dysphagia, pharyngoesophageal phase: Secondary | ICD-10-CM

## 2013-03-27 DIAGNOSIS — R1310 Dysphagia, unspecified: Secondary | ICD-10-CM | POA: Insufficient documentation

## 2013-03-27 DIAGNOSIS — K219 Gastro-esophageal reflux disease without esophagitis: Secondary | ICD-10-CM

## 2013-03-27 DIAGNOSIS — R1319 Other dysphagia: Secondary | ICD-10-CM | POA: Insufficient documentation

## 2013-03-27 MED ORDER — OMEPRAZOLE 20 MG PO CPDR: 20.0000 mg | DELAYED_RELEASE_CAPSULE | Freq: Every day | ORAL | Status: DC

## 2013-03-27 NOTE — Progress Notes (Signed)
      Primary Care Physician: Glenda Chroman., MD  Primary Gastroenterologist:  Garfield Cornea, MD   Chief Complaint  Patient presents with  . Follow-up    HPI: Pam Lopez is a 58 y.o. female here for further evaluation of chronic diarrhea and weight loss. Recent ileocolonoscopy with segmental biopsy was unremarkable except for tubular adenoma removed. Celiac screen was negative. Stool elastase was normal.  She states she's doing better at this point. Diarrhea has not returned. She's having regular bowel movements. Denies any blood in the stool or melena. She complains of recurrent heartburn. Has taken Prilosec in past. Sometimes waking up at night with regurgitation. Five years ago had ?EGD for swallowing problems. "It was fine". They suggested Barium Pill but did not have it done. She reports having a remote upper endoscopy at [facility] by Dr. Gala Romney background 2001. I don't have those records. Episodes of cannot swallow liquids or food about once per week. Food will not go down, gets lightheaded. Gets short of breath. She has had similar symptoms in the past but had gone quite a while without any of this occurring up until the last few months. She admits that she did not disclose this information at her last office visit because she was more concerned about her diarrhea at that point.  We discussed possibility of pursuing upper endoscopy at this time to further evaluate her swallowing difficulties, GERD, rule out Barrett's. She wants to try medication first. We discussed worse case scenario of esophageal malignancy which she voiced understanding.  Current Outpatient Prescriptions  Medication Sig Dispense Refill  . Oxycodone HCl 10 MG TABS Take 10 mg by mouth every 6 (six) hours as needed. pain       No current facility-administered medications for this visit.    Allergies as of 03/27/2013  . (No Known Allergies)    ROS:  General: Negative for anorexia, weight loss,  fever, chills, fatigue, weakness. ENT: Negative for hoarseness, difficulty swallowing , nasal congestion. CV: Negative for chest pain, angina, palpitations, dyspnea on exertion, peripheral edema.  Respiratory: Negative for dyspnea at rest, dyspnea on exertion, cough, sputum, wheezing.  GI: See history of present illness. GU:  Negative for dysuria, hematuria, urinary incontinence, urinary frequency, nocturnal urination.  Endo: Negative for unusual weight change.    Physical Examination:   BP 149/96  Pulse 75  Temp(Src) 98.1 F (36.7 C) (Oral)  Ht 5\' 3"  (1.6 m)  Wt 106 lb 9.6 oz (48.353 kg)  BMI 18.89 kg/m2  General: Well-nourished, well-developed in no acute distress.  Eyes: No icterus. Mouth: Oropharyngeal mucosa moist and pink , no lesions erythema or exudate. Lungs: Clear to auscultation bilaterally.  Heart: Regular rate and rhythm, no murmurs rubs or gallops.  Abdomen: Bowel sounds are normal, nontender, nondistended, no hepatosplenomegaly or masses, no abdominal bruits or hernia , no rebound or guarding.   Extremities: No lower extremity edema. No clubbing or deformities. Neuro: Alert and oriented x 4   Skin: Warm and dry, no jaundice.   Psych: Alert and cooperative, normal mood and affect.

## 2013-03-27 NOTE — Patient Instructions (Signed)
1. Start omeprazole 20 mg daily before breakfast. Prescription sent to pharmacy. 2. Please call if you decide to pursue upper endoscopy.

## 2013-03-27 NOTE — Assessment & Plan Note (Signed)
58 year old lady who recently underwent workup for chronic diarrhea and 10 pound weight loss. Workup unremarkable as previously outlined. At this point her weight is been stable. She denies any ongoing diarrhea. She now wishes concern for recurrent heartburn and esophageal dysphagia. She has been off PPI for quite some time. Discussed possibility of esophageal stricture, web, ring,esophageal dysmotility. Discussed possibility of underlying Barrett's renal esophageal malignancy. At this point she does not want to pursue upper endoscopy. Start omeprazole 20 mg daily. She will call in a few weeks and let us know if she was to pursue upper endoscopy. She was made aware that she will need to have recurrent 30 day H&P prior to procedure.

## 2013-03-27 NOTE — Progress Notes (Signed)
cc'd to pcp 

## 2013-04-23 ENCOUNTER — Telehealth: Payer: Self-pay | Admitting: General Practice

## 2013-04-23 NOTE — Telephone Encounter (Signed)
Patient wanted to know why her insurance didn't pay the entire amount for her colonoscopy.  I am going to reach out to Abby with [employer] to pull her EOB for an explanation.  Patient would like for me to call her back on her cell phone and leave a message with my findings.

## 2013-05-01 NOTE — Telephone Encounter (Signed)
I lm on the pt's voicemail concerning follow-up from [place].

## 2013-05-01 NOTE — Telephone Encounter (Signed)
Hi [name],  Sorry for the delay in responding I was out on vacation.  According to the EOB the amount due is for her coinsurance.  [employer] processed accordingly.  This was not routine so I am not sure maybe if that is where the patient is confused?  Please let me know if you need anything else.  Patient may want to call [employer] and check her benefits just so she understands what her portion maybe in the future.  Thank you,    Abby Christianson, [facility]  

## 2013-05-14 ENCOUNTER — Telehealth: Payer: Self-pay

## 2013-05-14 NOTE — Telephone Encounter (Signed)
Ivin Booty from [employer] is calling to see about her bill for her TCS. They have a new insurance company and she is asking about it for her because when she get home we are closed. Please advise

## 2013-05-14 NOTE — Telephone Encounter (Signed)
I spoke with Ardeen Jourdain, hr manager with [employer]at [phone]).  I explained to her that the patient presented to our office with a problem which warranted a tcs,so it was considered medical and not wellness.     I confirmed this information with Alisia Ferrari, coder for [facility].

## 2014-05-12 ENCOUNTER — Other Ambulatory Visit: Payer: Self-pay | Admitting: Gastroenterology

## 2018-02-06 ENCOUNTER — Encounter: Payer: Self-pay | Admitting: Internal Medicine

## 2021-07-22 ENCOUNTER — Encounter: Payer: Self-pay | Admitting: Internal Medicine

## 2021-07-27 ENCOUNTER — Encounter (INDEPENDENT_AMBULATORY_CARE_PROVIDER_SITE_OTHER): Payer: Self-pay

## 2021-08-24 ENCOUNTER — Ambulatory Visit: Payer: 59 | Admitting: Gastroenterology

## 2021-09-07 ENCOUNTER — Encounter: Payer: Self-pay | Admitting: *Deleted

## 2021-09-07 ENCOUNTER — Encounter: Payer: Self-pay | Admitting: Gastroenterology

## 2021-09-07 ENCOUNTER — Ambulatory Visit (INDEPENDENT_AMBULATORY_CARE_PROVIDER_SITE_OTHER): Payer: BC Managed Care – PPO | Admitting: Gastroenterology

## 2021-09-07 VITALS — BP 113/72 | HR 111 | Temp 97.5°F | Ht 62.0 in | Wt 107.0 lb

## 2021-09-07 DIAGNOSIS — Z8719 Personal history of other diseases of the digestive system: Secondary | ICD-10-CM

## 2021-09-07 DIAGNOSIS — D649 Anemia, unspecified: Secondary | ICD-10-CM

## 2021-09-07 DIAGNOSIS — Z86010 Personal history of colonic polyps: Secondary | ICD-10-CM | POA: Diagnosis not present

## 2021-09-07 DIAGNOSIS — R1319 Other dysphagia: Secondary | ICD-10-CM

## 2021-09-07 DIAGNOSIS — K219 Gastro-esophageal reflux disease without esophagitis: Secondary | ICD-10-CM

## 2021-09-07 DIAGNOSIS — Z860101 Personal history of adenomatous and serrated colon polyps: Secondary | ICD-10-CM | POA: Insufficient documentation

## 2021-09-07 MED ORDER — PEG 3350-KCL-NA BICARB-NACL 420 G PO SOLR: 4000.0000 mL | Freq: Once | ORAL | 0 refills | Status: AC

## 2021-09-07 NOTE — Patient Instructions (Addendum)
Your most recent labs show very mild anemia. I don't have any other labs to compare but based on your PCP advising you to take iron, I suspect your numbers were lower in the past. Recommend colonoscopy and upper endoscopy for anemia, swallowing concerns, and history of colon polyps. See separate instructions. I would recommend you follow up with your PCP regarding heart murmur. You may need an ultrasound of your heart if you have not had that done in the past few years. Please discuss with your PCP.  Please continue omeprazole '20mg'$  daily. Refrain from Pepto use at this time as it can make your stools black which cause confusion regarding if you are still having black stools possible due to bleeding.  For diarrhea, you can use imodium 1/2 tablet once per day as needed but would refrain from using this for at least 1 week before your colonoscopy.

## 2021-09-07 NOTE — Progress Notes (Signed)
GI Office Note    Referring Provider: Glenda Chroman, MD Primary Care Physician:  Glenda Chroman, MD  Primary Gastroenterologist: Garfield Cornea, MD   Chief Complaint   Chief Complaint  Patient presents with   Anemia     History of Present Illness   Pam Lopez is a 66 y.o. female presenting today for further evaluation of anemia at the request of Dr. Woody Seller.   Last seen in 2015 for GERD. She has history of chronic diarrhea with previous celiac serologies negative. Stool elastase normal all back in 2015. Random colon biopsies negative. Suspected IBS-D. She is overdue for surveillance colonoscopy for history of colonic adenoma.   Patient states from time to time she has been advised she is anemic and has taken iron intermittently.  She reports a couple visits back with her PCP, she was advised to take her iron every single day.  She has been doing this for about 3 months.  She states she told her provider she was having black stools even before taking iron.  Also intermittently takes Pepto-Bismol but she cannot remember if she was taking it around the time of black stools.  Stools remain dark/black at times on iron.  All of her stools are loose.  Typically has loose bowel movement within an hour after meals.  Denies any postprandial stools after "snacks".  Usually 1 stool per day, twice if she eats 2 meals.  No bright red blood per rectum.  Denies any abdominal pain.  Heartburn is well controlled on omeprazole.  Denies solid food or pill dysphagia.  Sometimes when she tries to drink, she feels like her throat closes up, cannot breathe and feels like she is going to pass out.  She is unable to swallow when this happens, has to spit the liquid out.  Labs from PCP dated July 17, 2021: Ferritin 50, hemoglobin 11 (11.1-15.9), hematocrit 32.1, MCV 92, platelets 453,000, TIBC 317, iron 64, iron saturation is 20%.   Colonoscopy 01/2013: single polyp removed (tubular adenoma), random colon  biopsies negative.    Medications   Current Outpatient Medications  Medication Sig Dispense Refill   albuterol (VENTOLIN HFA) 108 (90 Base) MCG/ACT inhaler Inhale 1 puff into the lungs every 6 (six) hours as needed for wheezing or shortness of breath.     aspirin EC 81 MG tablet Take 81 mg by mouth daily. Swallow whole.     Ferrous Sulfate (IRON) 325 (65 Fe) MG TABS Take 325 mg by mouth daily.     lisinopril-hydrochlorothiazide (ZESTORETIC) 10-12.5 MG tablet Take 1 tablet by mouth daily.     loratadine (CLARITIN) 10 MG tablet Take 10 mg by mouth daily.     omeprazole (PRILOSEC) 20 MG capsule TAKE (1) CAPSULE BY MOUTH ONCE DAILY. 30 capsule 5   pravastatin (PRAVACHOL) 40 MG tablet Take 40 mg by mouth daily.     Vitamin D, Ergocalciferol, (DRISDOL) 1.25 MG (50000 UNIT) CAPS capsule Take 50,000 Units by mouth every 7 (seven) days.     No current facility-administered medications for this visit.    Allergies   Allergies as of 09/07/2021   (No Known Allergies)    Past Medical History   Past Medical History:  Diagnosis Date   Dyslipidemia    HTN (hypertension)    not on medication   Hypertension    Panic disorder     Past Surgical History   Past Surgical History:  Procedure Laterality Date   BREAST SURGERY  biopsy   COLONOSCOPY  04/29/99   DTO:[id number] internal hemorrhoids/normal colon,rectum,and terinal ileum   COLONOSCOPY N/A 02/15/2013   [id number] polyp-removed as described above. Segmental biopsy performed. Colon polyp-tubular adenoma. Random bx negative    Past Family History   Family History  Problem Relation Age of Onset   Cancer Mother        stomach   Cancer Father        lung   Hypertension Father    Cancer Sister        breast   Cancer Sister        female   Colon cancer Neg Hx     Past Social History   Social History   Socioeconomic History   Marital status: Single    Spouse name: Not on file   Number of children: Not on file   Years of  education: Not on file   Highest education level: Not on file  Occupational History   Occupation: Science writer: [employer]  Tobacco Use   Smoking status: Every Day    Packs/day: 0.50    Years: 40.00    Total pack years: 20.00    Types: Cigarettes   Smokeless tobacco: Not on file  Substance and Sexual Activity   Alcohol use: Yes    Comment: occ   Drug use: No   Sexual activity: Not Currently  Other Topics Concern   Not on file  Social History Narrative   Not on file   Social Determinants of Health   Financial Resource Strain: Not on file  Food Insecurity: Not on file  Transportation Needs: Not on file  Physical Activity: Not on file  Stress: Not on file  Social Connections: Not on file  Intimate Partner Violence: Not on file    Review of Systems   General: Negative for anorexia, weight loss, fever, chills, fatigue, weakness. Eyes: Negative for vision changes.  ENT: Negative for hoarseness, nasal congestion. See hpi CV: Negative for chest pain, angina, palpitations, dyspnea on exertion, peripheral edema.  Respiratory: Negative for dyspnea at rest, dyspnea on exertion, cough, sputum, wheezing.  GI: See history of present illness. GU:  Negative for dysuria, hematuria, urinary incontinence, urinary frequency, nocturnal urination.  MS: Negative for joint pain, low back pain.  Derm: Negative for rash or itching.  Neuro: Negative for weakness, abnormal sensation, seizure, frequent headaches, memory loss,  confusion.  Psych: Negative for anxiety, depression, suicidal ideation, hallucinations.  Endo: Negative for unusual weight change.  Heme: Negative for bruising or bleeding. Allergy: Negative for rash or hives.  Physical Exam   BP 113/72 (BP Location: Left Arm, Patient Position: Sitting, Cuff Size: Normal)   Pulse (!) 111   Temp (!) 97.5 F (36.4 C) (Temporal)   Ht '5\' 2"'$  (1.575 m)   Wt 107 lb (48.5 kg)   BMI 19.57 kg/m    General: Well-nourished,  well-developed in no acute distress.  Head: Normocephalic, atraumatic.   Eyes: Conjunctiva pink, no icterus. Mouth: Oropharyngeal mucosa moist and pink , no lesions erythema or exudate. Neck: Supple without thyromegaly, masses, or lymphadenopathy.  Lungs: Clear to auscultation bilaterally.  Heart: Regular rate and rhythm, no rubs or gallops. 2/6 SEM Abdomen: Bowel sounds are normal, nontender, nondistended, no hepatosplenomegaly or masses,  no abdominal bruits or hernia, no rebound or guarding.   Rectal: not performed Extremities: No lower extremity edema. No clubbing or deformities.  Neuro: Alert and oriented x 4 , grossly normal neurologically.  Skin:  Warm and dry, no rash or jaundice.   Psych: Alert and cooperative, normal mood and affect.  Labs   See hpi  Imaging Studies   No results found.  Assessment   Anemia: she reports use of iron intermittently.  Three months ago reports having labs indicating drop in her hemoglobin, states PCP told her to take iron every day.  Labs we have available show low normal iron saturation.  Hemoglobin mildly low at 11.0.  She reports possible melena prior to restarting her oral iron.  However given use of intermittent Pepto-Bismol, cannot exclude stool discoloration secondary to Pepto.  She has a history of adenomatous colon polyps, overdue for surveillance colonoscopy.  Therefore colonoscopy recommended, will plan upper endoscopy as well for upper GI complaints.  Dysphagia: Notes to liquids only.  Question upper esophageal web or spasm based on her history.  Question melena.  Plan for upper endoscopy.   PLAN   Colonoscopy/EGD/ED Dr. Gala Romney.  ASA 3.  I have discussed the risks, alternatives, benefits with regards to but not limited to the risk of reaction to medication, bleeding, infection, perforation and the patient is agreeable to proceed. Written consent to be obtained. Continue omeprazole 20 mg daily. Request she refrain from Pepto-Bismol  use for now. She may use Imodium half a tablet daily as needed for diarrhea.  She will hold for 1 week prior to colonoscopy. I have asked her to discuss faint heart murmur with PCP at her next visit.  Patient states she has had this murmur for a long time but nothing has ever been advised.  Possibly functional murmur in the setting of anemia.  Asymptomatic.   Laureen Ochs. Bobby Rumpf, [place], [facility]

## 2021-09-08 ENCOUNTER — Telehealth: Payer: Self-pay | Admitting: *Deleted

## 2021-09-08 NOTE — Telephone Encounter (Signed)
[place] APPR[place]ED VIA [employer] FOR TCS AUTH# [id number], DOS: 09/08/21-11/06/21 EGD/DIL auth# [id number], DOS: 09/08/21-11/06/21

## 2021-10-02 NOTE — Patient Instructions (Signed)
Pam Lopez  10/02/2021     '@PREFPERIOPPHARMACY'$ @   Your procedure is scheduled on  10/09/2021.   Report to [facility] at  1115  A.M.   Call this number if you have problems the morning of surgery:  [phone]   Remember:  Follow the diet and prep instructions given to you by the office.        Use your inhaler before you come and bring your rescue inhaler with you.     Take these medicines the morning of surgery with A SIP OF WATER                                                     claritin, prilosec.     Do not wear jewelry, make-up or nail polish.  Do not wear lotions, powders, or perfumes, or deodorant.  Do not shave 48 hours prior to surgery.  Men may shave face and neck.  Do not bring valuables to the hospital.  [facility] is not responsible for any belongings or valuables.  Contacts, dentures or bridgework may not be worn into surgery.  Leave your suitcase in the car.  After surgery it may be brought to your room.  For patients admitted to the hospital, discharge time will be determined by your treatment team.  Patients discharged the day of surgery will not be allowed to drive home and must have someone with them for 24 hours.    Special instructions:   DO NOT smoke tobacco or vape for 24 hours before your procedure.  Please read over the following fact sheets that you were given. Anesthesia Post-op Instructions and Care and Recovery After Surgery      Upper Endoscopy, Adult, Care After After the procedure, it is common to have a sore throat. It is also common to have: Mild stomach pain or discomfort. Bloating. Nausea. Follow these instructions at home: The instructions below may help you care for yourself at home. Your health care provider may give you more instructions. If you have questions, ask your health care provider. If you were given a sedative during the procedure, it can affect you for several hours. Do not drive or operate  machinery until your health care provider says that it is safe. If you will be going home right after the procedure, plan to have a responsible adult: Take you home from the hospital or clinic. You will not be allowed to drive. Care for you for the time you are told. Follow instructions from your health care provider about what you may eat and drink. Return to your normal activities as told by your health care provider. Ask your health care provider what activities are safe for you. Take over-the-counter and prescription medicines only as told by your health care provider. Contact a health care provider if you: Have a sore throat that lasts longer than one day. Have trouble swallowing. Have a fever. Get help right away if you: Vomit blood or your vomit looks like coffee grounds. Have bloody, black, or tarry stools. Have a very bad sore throat or you cannot swallow. Have difficulty breathing or very bad pain in your chest or abdomen. These symptoms may be an emergency. Get help right away. Call 911. Do not wait to see if the  symptoms will go away. Do not drive yourself to the hospital. Summary After the procedure, it is common to have a sore throat, mild stomach discomfort, bloating, and nausea. If you were given a sedative during the procedure, it can affect you for several hours. Do not drive until your health care provider says that it is safe. Follow instructions from your health care provider about what you may eat and drink. Return to your normal activities as told by your health care provider. This information is not intended to replace advice given to you by your health care provider. Make sure you discuss any questions you have with your health care provider. Document Revised: 04/15/2021 Document Reviewed: 04/15/2021 Elsevier Patient Education  [place]. Esophageal Dilatation Esophageal dilatation, also called esophageal dilation, is a procedure to widen or open a  blocked or narrowed part of the esophagus. The esophagus is the part of the body that moves food and liquid from the mouth to the stomach. You may need this procedure if: You have a buildup of scar tissue in your esophagus that makes it difficult, painful, or impossible to swallow. This can be caused by gastroesophageal reflux disease (GERD). You have cancer of the esophagus. There is a problem with how food moves through your esophagus. In some cases, you may need this procedure repeated at a later time to dilate the esophagus gradually. Tell a health care provider about: Any allergies you have. All medicines you are taking, including vitamins, herbs, eye drops, creams, and over-the-counter medicines. Any problems you or family members have had with anesthetic medicines. Any blood disorders you have. Any surgeries you have had. Any medical conditions you have. Any antibiotic medicines you are required to take before dental procedures. Whether you are pregnant or may be pregnant. What are the risks? Generally, this is a safe procedure. However, problems may occur, including: Bleeding due to a tear in the lining of the esophagus. A hole, or perforation, in the esophagus. What happens before the procedure? Ask your health care provider about: Changing or stopping your regular medicines. This is especially important if you are taking diabetes medicines or blood thinners. Taking medicines such as aspirin and ibuprofen. These medicines can thin your blood. Do not take these medicines unless your health care provider tells you to take them. Taking over-the-counter medicines, vitamins, herbs, and supplements. Follow instructions from your health care provider about eating or drinking restrictions. Plan to have a responsible adult take you home from the hospital or clinic. Plan to have a responsible adult care for you for the time you are told after you leave the hospital or clinic. This is  important. What happens during the procedure? You may be given a medicine to help you relax (sedative). A numbing medicine may be sprayed into the back of your throat, or you may gargle the medicine. Your health care provider may perform the dilatation using various surgical instruments, such as: Simple dilators. This instrument is carefully placed in the esophagus to stretch it. Guided wire bougies. This involves using an endoscope to insert a wire into the esophagus. A dilator is passed over this wire to enlarge the esophagus. Then the wire is removed. Balloon dilators. An endoscope with a small balloon is inserted into the esophagus. The balloon is inflated to stretch the esophagus and open it up. The procedure may vary among health care providers and hospitals. What can I expect after the procedure? Your blood pressure, heart rate, breathing rate, and blood  oxygen level will be monitored until you leave the hospital or clinic. Your throat may feel slightly sore and numb. This will get better over time. You will not be allowed to eat or drink until your throat is no longer numb. When you are able to drink, urinate, and sit on the edge of the bed without nausea or dizziness, you may be able to return home. Follow these instructions at home: Take over-the-counter and prescription medicines only as told by your health care provider. If you were given a sedative during the procedure, it can affect you for several hours. Do not drive or operate machinery until your health care provider says that it is safe. Plan to have a responsible adult care for you for the time you are told. This is important. Follow instructions from your health care provider about any eating or drinking restrictions. Do not use any products that contain nicotine or tobacco, such as cigarettes, e-cigarettes, and chewing tobacco. If you need help quitting, ask your health care provider. Keep all follow-up visits. This is  important. Contact a health care provider if: You have a fever. You have pain that is not relieved by medicine. Get help right away if: You have chest pain. You have trouble breathing. You have trouble swallowing. You vomit blood. You have black, tarry, or bloody stools. These symptoms may represent a serious problem that is an emergency. Do not wait to see if the symptoms will go away. Get medical help right away. Call your local emergency services (911 in the [place] Do not drive yourself to the hospital. Summary Esophageal dilatation, also called esophageal dilation, is a procedure to widen or open a blocked or narrowed part of the esophagus. Plan to have a responsible adult take you home from the hospital or clinic. For this procedure, a numbing medicine may be sprayed into the back of your throat, or you may gargle the medicine. Do not drive or operate machinery until your health care provider says that it is safe. This information is not intended to replace advice given to you by your health care provider. Make sure you discuss any questions you have with your health care provider. Document Revised: 05/23/2019 Document Reviewed: 05/23/2019 Elsevier Patient Education  [place]. Colonoscopy, Adult, Care After The following information offers guidance on how to care for yourself after your procedure. Your health care provider may also give you more specific instructions. If you have problems or questions, contact your health care provider. What can I expect after the procedure? After the procedure, it is common to have: A small amount of blood in your stool for 24 hours after the procedure. Some gas. Mild cramping or bloating of your abdomen. Follow these instructions at home: Eating and drinking  Drink enough fluid to keep your urine pale yellow. Follow instructions from your health care provider about eating or drinking restrictions. Resume your normal diet as told by  your health care provider. Avoid heavy or fried foods that are hard to digest. Activity Rest as told by your health care provider. Avoid sitting for a long time without moving. Get up to take short walks every 1-2 hours. This is important to improve blood flow and breathing. Ask for help if you feel weak or unsteady. Return to your normal activities as told by your health care provider. Ask your health care provider what activities are safe for you. Managing cramping and bloating  Try walking around when you have cramps or feel bloated.  If directed, apply heat to your abdomen as told by your health care provider. Use the heat source that your health care provider recommends, such as a moist heat pack or a heating pad. Place a towel between your skin and the heat source. Leave the heat on for 20-30 minutes. Remove the heat if your skin turns bright red. This is especially important if you are unable to feel pain, heat, or cold. You have a greater risk of getting burned. General instructions If you were given a sedative during the procedure, it can affect you for several hours. Do not drive or operate machinery until your health care provider says that it is safe. For the first 24 hours after the procedure: Do not sign important documents. Do not drink alcohol. Do your regular daily activities at a slower pace than normal. Eat soft foods that are easy to digest. Take over-the-counter and prescription medicines only as told by your health care provider. Keep all follow-up visits. This is important. Contact a health care provider if: You have blood in your stool 2-3 days after the procedure. Get help right away if: You have more than a small spotting of blood in your stool. You have large blood clots in your stool. You have swelling of your abdomen. You have nausea or vomiting. You have a fever. You have increasing pain in your abdomen that is not relieved with medicine. These symptoms may  be an emergency. Get help right away. Call 911. Do not wait to see if the symptoms will go away. Do not drive yourself to the hospital. Summary After the procedure, it is common to have a small amount of blood in your stool. You may also have mild cramping and bloating of your abdomen. If you were given a sedative during the procedure, it can affect you for several hours. Do not drive or operate machinery until your health care provider says that it is safe. Get help right away if you have a lot of blood in your stool, nausea or vomiting, a fever, or increased pain in your abdomen. This information is not intended to replace advice given to you by your health care provider. Make sure you discuss any questions you have with your health care provider. Document Revised: 08/27/2020 Document Reviewed: 08/27/2020 Elsevier Patient Education  [place] After This sheet gives you information about how to care for yourself after your procedure. Your health care provider may also give you more specific instructions. If you have problems or questions, contact your health care provider. What can I expect after the procedure? After the procedure, it is common to have: Tiredness. Forgetfulness about what happened after the procedure. Impaired judgment for important decisions. Nausea or vomiting. Some difficulty with balance. Follow these instructions at home: For the time period you were told by your health care provider:     Rest as needed. Do not participate in activities where you could fall or become injured. Do not drive or use machinery. Do not drink alcohol. Do not take sleeping pills or medicines that cause drowsiness. Do not make important decisions or sign legal documents. Do not take care of children on your own. Eating and drinking Follow the diet that is recommended by your health care provider. Drink enough fluid to keep your urine pale  yellow. If you vomit: Drink water, juice, or soup when you can drink without vomiting. Make sure you have little or no nausea before eating solid foods.  General instructions Have a responsible adult stay with you for the time you are told. It is important to have someone help care for you until you are awake and alert. Take over-the-counter and prescription medicines only as told by your health care provider. If you have sleep apnea, surgery and certain medicines can increase your risk for breathing problems. Follow instructions from your health care provider about wearing your sleep device: Anytime you are sleeping, including during daytime naps. While taking prescription pain medicines, sleeping medicines, or medicines that make you drowsy. Avoid smoking. Keep all follow-up visits as told by your health care provider. This is important. Contact a health care provider if: You keep feeling nauseous or you keep vomiting. You feel light-headed. You are still sleepy or having trouble with balance after 24 hours. You develop a rash. You have a fever. You have redness or swelling around the IV site. Get help right away if: You have trouble breathing. You have new-onset confusion at home. Summary For several hours after your procedure, you may feel tired. You may also be forgetful and have poor judgment. Have a responsible adult stay with you for the time you are told. It is important to have someone help care for you until you are awake and alert. Rest as told. Do not drive or operate machinery. Do not drink alcohol or take sleeping pills. Get help right away if you have trouble breathing, or if you suddenly become confused. This information is not intended to replace advice given to you by your health care provider. Make sure you discuss any questions you have with your health care provider. Document Revised: 12/09/2020 Document Reviewed: 12/07/2018 Elsevier Patient Education  [place].

## 2021-10-05 ENCOUNTER — Encounter (HOSPITAL_COMMUNITY): Payer: Self-pay

## 2021-10-05 ENCOUNTER — Encounter (HOSPITAL_COMMUNITY)
Admission: RE | Admit: 2021-10-05 | Discharge: 2021-10-05 | Disposition: A | Discharge status: Home / Self Care | Payer: BC Managed Care – PPO | Source: Ambulatory Visit | Attending: Internal Medicine | Admitting: Internal Medicine

## 2021-10-05 VITALS — BP 128/69 | HR 80 | Temp 97.7°F | Resp 18 | Ht 62.0 in | Wt 106.9 lb

## 2021-10-05 DIAGNOSIS — Z01818 Encounter for other preprocedural examination: Secondary | ICD-10-CM | POA: Diagnosis present

## 2021-10-05 DIAGNOSIS — D649 Anemia, unspecified: Secondary | ICD-10-CM | POA: Diagnosis not present

## 2021-10-05 DIAGNOSIS — Z79899 Other long term (current) drug therapy: Secondary | ICD-10-CM | POA: Diagnosis not present

## 2021-10-05 DIAGNOSIS — I10 Essential (primary) hypertension: Secondary | ICD-10-CM | POA: Diagnosis not present

## 2021-10-05 LAB — CBC WITH DIFFERENTIAL/PLATELET
Abs Immature Granulocytes: 0.04 10*3/uL (ref 0.00–0.07)
Basophils Absolute: 0 10*3/uL (ref 0.0–0.1)
Basophils Relative: 0 %
Eosinophils Absolute: 0.1 10*3/uL (ref 0.0–0.5)
Eosinophils Relative: 1 %
HCT: 33.5 % — ABNORMAL LOW (ref 36.0–46.0)
Hemoglobin: 11.6 g/dL — ABNORMAL LOW (ref 12.0–15.0)
Immature Granulocytes: 0 %
Lymphocytes Relative: 22 %
Lymphs Abs: 2.6 10*3/uL (ref 0.7–4.0)
MCH: 31.7 pg (ref 26.0–34.0)
MCHC: 34.6 g/dL (ref 30.0–36.0)
MCV: 91.5 fL (ref 80.0–100.0)
Monocytes Absolute: 0.7 10*3/uL (ref 0.1–1.0)
Monocytes Relative: 6 %
Neutro Abs: 8.4 10*3/uL — ABNORMAL HIGH (ref 1.7–7.7)
Neutrophils Relative %: 71 %
Platelets: 507 10*3/uL — ABNORMAL HIGH (ref 150–400)
RBC: 3.66 MIL/uL — ABNORMAL LOW (ref 3.87–5.11)
RDW: 12.4 % (ref 11.5–15.5)
WBC: 11.9 10*3/uL — ABNORMAL HIGH (ref 4.0–10.5)
nRBC: 0 % (ref 0.0–0.2)

## 2021-10-05 LAB — BASIC METABOLIC PANEL
Anion gap: 9 (ref 5–15)
BUN: 16 mg/dL (ref 8–23)
CO2: 26 mmol/L (ref 22–32)
Calcium: 9.1 mg/dL (ref 8.9–10.3)
Chloride: 91 mmol/L — ABNORMAL LOW (ref 98–111)
Creatinine, Ser: 0.62 mg/dL (ref 0.44–1.00)
GFR, Estimated: >60 mL/min (ref >60)
Glucose, Bld: 96 mg/dL (ref 70–99)
Potassium: 4 mmol/L (ref 3.5–5.1)
Sodium: 126 mmol/L — ABNORMAL LOW (ref 135–145)

## 2021-10-09 ENCOUNTER — Ambulatory Visit (HOSPITAL_COMMUNITY): Payer: BC Managed Care – PPO | Admitting: Anesthesiology

## 2021-10-09 ENCOUNTER — Encounter (HOSPITAL_COMMUNITY): Payer: Self-pay | Admitting: Internal Medicine

## 2021-10-09 ENCOUNTER — Ambulatory Visit (HOSPITAL_COMMUNITY)
Admission: RE | Admit: 2021-10-09 | Discharge: 2021-10-09 | Disposition: A | Discharge status: Home / Self Care | Payer: BC Managed Care – PPO | Source: Ambulatory Visit | Attending: Internal Medicine | Admitting: Internal Medicine

## 2021-10-09 ENCOUNTER — Encounter (HOSPITAL_COMMUNITY)
Admission: RE | Disposition: A | Discharge status: Home / Self Care | Payer: Self-pay | Source: Ambulatory Visit | Attending: Internal Medicine

## 2021-10-09 DIAGNOSIS — I10 Essential (primary) hypertension: Secondary | ICD-10-CM | POA: Insufficient documentation

## 2021-10-09 DIAGNOSIS — D500 Iron deficiency anemia secondary to blood loss (chronic): Secondary | ICD-10-CM | POA: Insufficient documentation

## 2021-10-09 DIAGNOSIS — Z86010 Personal history of colonic polyps: Secondary | ICD-10-CM | POA: Insufficient documentation

## 2021-10-09 DIAGNOSIS — K635 Polyp of colon: Secondary | ICD-10-CM | POA: Diagnosis not present

## 2021-10-09 DIAGNOSIS — K449 Diaphragmatic hernia without obstruction or gangrene: Secondary | ICD-10-CM | POA: Insufficient documentation

## 2021-10-09 DIAGNOSIS — R1310 Dysphagia, unspecified: Secondary | ICD-10-CM | POA: Insufficient documentation

## 2021-10-09 DIAGNOSIS — K648 Other hemorrhoids: Secondary | ICD-10-CM | POA: Diagnosis not present

## 2021-10-09 DIAGNOSIS — Z09 Encounter for follow-up examination after completed treatment for conditions other than malignant neoplasm: Secondary | ICD-10-CM | POA: Diagnosis not present

## 2021-10-09 DIAGNOSIS — K219 Gastro-esophageal reflux disease without esophagitis: Secondary | ICD-10-CM | POA: Insufficient documentation

## 2021-10-09 DIAGNOSIS — F17210 Nicotine dependence, cigarettes, uncomplicated: Secondary | ICD-10-CM | POA: Diagnosis not present

## 2021-10-09 HISTORY — PX: BIOPSY: SHX5522

## 2021-10-09 HISTORY — PX: MALONEY DILATION: SHX5535

## 2021-10-09 HISTORY — PX: COLONOSCOPY WITH PROPOFOL: SHX5780

## 2021-10-09 HISTORY — PX: ESOPHAGOGASTRODUODENOSCOPY (EGD) WITH PROPOFOL: SHX5813

## 2021-10-09 HISTORY — PX: POLYPECTOMY: SHX5525

## 2021-10-09 SURGERY — COLONOSCOPY WITH PROPOFOL
Anesthesia: General

## 2021-10-09 MED ORDER — LACTATED RINGERS IV SOLN: INTRAVENOUS | Status: DC

## 2021-10-09 MED ORDER — LIDOCAINE HCL (CARDIAC) PF 100 MG/5ML IV SOSY
PREFILLED_SYRINGE | INTRAVENOUS | Status: DC | PRN
  Administered 2021-10-09: 60 mg via INTRAVENOUS

## 2021-10-09 MED ORDER — PROPOFOL 10 MG/ML IV BOLUS
INTRAVENOUS | Status: DC | PRN
  Administered 2021-10-09: 30 mg via INTRAVENOUS
  Administered 2021-10-09 (×3): 20 mg via INTRAVENOUS
  Administered 2021-10-09: 40 mg via INTRAVENOUS
  Administered 2021-10-09: 20 mg via INTRAVENOUS

## 2021-10-09 MED ORDER — PROPOFOL 500 MG/50ML IV EMUL
INTRAVENOUS | Status: DC | PRN
  Administered 2021-10-09: 200 ug/kg/min via INTRAVENOUS

## 2021-10-09 NOTE — Op Note (Signed)
[facility] Patient Name: Pam Lopez Procedure Date: 10/09/2021 1:24 PM MRN: [id number] Date of Birth: 1955-10-10 Attending MD: Norvel Richards , MD CSN: [id number] Age: 66 Admit Type: Outpatient Procedure:                Colonoscopy Indications:              High risk colon cancer surveillance: Personal                            history of colonic polyps Providers:                Norvel Richards, MD, Caprice Kluver, Rosina Lowenstein,                            RN Referring MD:              Medicines:                Propofol per Anesthesia Complications:            No immediate complications. Estimated Blood Loss:     Estimated blood loss was minimal. Procedure:                Pre-Anesthesia Assessment:                           - Prior to the procedure, a History and Physical                            was performed, and patient medications and                            allergies were reviewed. The patient's tolerance of                            previous anesthesia was also reviewed. The risks                            and benefits of the procedure and the sedation                            options and risks were discussed with the patient.                            All questions were answered, and informed consent                            was obtained. Prior Anticoagulants: The patient has                            taken no previous anticoagulant or antiplatelet                            agents. ASA Grade Assessment: III - A patient with  severe systemic disease. After reviewing the risks                            and benefits, the patient was deemed in                            satisfactory condition to undergo the procedure.                           After obtaining informed consent, the colonoscope                            was passed under direct vision. Throughout the                            procedure, the patient's blood  pressure, pulse, and                            oxygen saturations were monitored continuously. The                            [id number]) scope was introduced through the                            anus and advanced to the the cecum, identified by                            appendiceal orifice and ileocecal valve. The                            colonoscopy was performed without difficulty. The                            patient tolerated the procedure well. The quality                            of the bowel preparation was adequate. Scope In: 1:49:08 PM Scope Out: 2:01:00 PM Scope Withdrawal Time: 0 hours 6 minutes 28 seconds  Total Procedure Duration: 0 hours 11 minutes 52 seconds  Findings:      The perianal and digital rectal examinations were normal. Internal       hemorrhoids and anal papilla present.      A 5 mm polyp was found in the rectum. The polyp was sessile. The polyp       was removed with a cold snare. Resection and retrieval were complete.       Estimated blood loss was minimal.      The exam was otherwise without abnormality on direct and retroflexion       views. Impression:               - One 5 mm polyp in the rectum, removed with a cold                            snare. Resected and retrieved. Internal  hemorrhoids/anal papilla.                           - The examination was otherwise normal on direct                            and retroflexion views. Moderate Sedation:      Moderate (conscious) sedation was personally administered by an       anesthesia professional. The following parameters were monitored: oxygen       saturation, heart rate, blood pressure, and response to care. Recommendation:           - Patient has a contact number available for                            emergencies. The signs and symptoms of potential                            delayed complications were discussed with the                             patient. Return to normal activities tomorrow.                            Written discharge instructions were provided to the                            patient.                           - Resume previous diet.                           - Continue present medications.                           - Repeat colonoscopy date to be determined after                            pending pathology results are reviewed for                            surveillance.                           - Return to GI office in 3 months. See EGD report. Procedure Code(s):        --- Professional ---                           [phone], Colonoscopy, flexible; with removal of                            tumor(s), polyp(s), or other lesion(s) by snare                            technique Diagnosis Code(s):        ---  Professional ---                           Z86.010, Personal history of colonic polyps                           K62.1, Rectal polyp CPT copyright 2019 [employer]. All rights reserved. The codes documented in this report are preliminary and upon coder review may  be revised to meet current compliance requirements. Cristopher Estimable. Brei Pociask, MD Norvel Richards, MD 10/09/2021 2:07:11 PM This report has been signed electronically. Number of Addenda: 0

## 2021-10-09 NOTE — Discharge Instructions (Signed)
EGD Discharge instructions Please read the instructions outlined below and refer to this sheet in the next few weeks. These discharge instructions provide you with general information on caring for yourself after you leave the hospital. Your doctor may also give you specific instructions. While your treatment has been planned according to the most current medical practices available, unavoidable complications occasionally occur. If you have any problems or questions after discharge, please call your doctor. ACTIVITY You may resume your regular activity but move at a slower pace for the next 24 hours.  Take frequent rest periods for the next 24 hours.  Walking will help expel (get rid of) the air and reduce the bloated feeling in your abdomen.  No driving for 24 hours (because of the anesthesia (medicine) used during the test).  You may shower.  Do not sign any important legal documents or operate any machinery for 24 hours (because of the anesthesia used during the test).  NUTRITION Drink plenty of fluids.  You may resume your normal diet.  Begin with a light meal and progress to your normal diet.  Avoid alcoholic beverages for 24 hours or as instructed by your caregiver.  MEDICATIONS You may resume your normal medications unless your caregiver tells you otherwise.  WHAT YOU CAN EXPECT TODAY You may experience abdominal discomfort such as a feeling of fullness or "gas" pains.  FOLLOW-UP Your doctor will discuss the results of your test with you.  SEEK IMMEDIATE MEDICAL ATTENTION IF ANY OF THE FOLLOWING OCCUR: Excessive nausea (feeling sick to your stomach) and/or vomiting.  Severe abdominal pain and distention (swelling).  Trouble swallowing.  Temperature over 101 F (37.8 C).  Rectal bleeding or vomiting of blood.    Colonoscopy Discharge Instructions  Read the instructions outlined below and refer to this sheet in the next few weeks. These discharge instructions provide you with  general information on caring for yourself after you leave the hospital. Your doctor may also give you specific instructions. While your treatment has been planned according to the most current medical practices available, unavoidable complications occasionally occur. If you have any problems or questions after discharge, call Dr. Gala Romney at [phone]. ACTIVITY You may resume your regular activity, but move at a slower pace for the next 24 hours.  Take frequent rest periods for the next 24 hours.  Walking will help get rid of the air and reduce the bloated feeling in your belly (abdomen).  No driving for 24 hours (because of the medicine (anesthesia) used during the test).   Do not sign any important legal documents or operate any machinery for 24 hours (because of the anesthesia used during the test).  NUTRITION Drink plenty of fluids.  You may resume your normal diet as instructed by your doctor.  Begin with a light meal and progress to your normal diet. Heavy or fried foods are harder to digest and may make you feel sick to your stomach (nauseated).  Avoid alcoholic beverages for 24 hours or as instructed.  MEDICATIONS You may resume your normal medications unless your doctor tells you otherwise.  WHAT YOU CAN EXPECT TODAY Some feelings of bloating in the abdomen.  Passage of more gas than usual.  Spotting of blood in your stool or on the toilet paper.  IF YOU HAD POLYPS REMOVED DURING THE COLONOSCOPY: No aspirin products for 7 days or as instructed.  No alcohol for 7 days or as instructed.  Eat a soft diet for the next 24 hours.  FINDING  OUT THE RESULTS OF YOUR TEST Not all test results are available during your visit. If your test results are not back during the visit, make an appointment with your caregiver to find out the results. Do not assume everything is normal if you have not heard from your caregiver or the medical facility. It is important for you to follow up on all of your test  results.  SEEK IMMEDIATE MEDICAL ATTENTION IF: You have more than a spotting of blood in your stool.  Your belly is swollen (abdominal distention).  You are nauseated or vomiting.  You have a temperature over 101.  You have abdominal pain or discomfort that is severe or gets worse throughout the day.     Your esophagus was stretched today.  You had inflammatory changes in your stomach.  Biopsies taken.  1 polyp removed from your colon  Further recommendations to follow pending review of pathology report  At patient request, I called Jocelyn Lamer at [phone] -  reviewed findings and recommendations

## 2021-10-09 NOTE — Anesthesia Preprocedure Evaluation (Signed)
Anesthesia Evaluation  Patient identified by MRN, date of birth, ID band Patient awake    Reviewed: Allergy & Precautions, H&P , NPO status , Patient's Chart, lab work & pertinent test results, reviewed documented beta blocker date and time   Airway Mallampati: II  TM Distance: >3 FB Neck ROM: full    Dental no notable dental hx.    Pulmonary neg pulmonary ROS, Current Smoker and Patient abstained from smoking.,    Pulmonary exam normal breath sounds clear to auscultation       Cardiovascular Exercise Tolerance: Good hypertension, negative cardio ROS   Rhythm:regular Rate:Normal     Neuro/Psych PSYCHIATRIC DISORDERS Anxiety negative neurological ROS     GI/Hepatic Neg liver ROS, GERD  Medicated,  Endo/Other  negative endocrine ROS  Renal/GU negative Renal ROS  negative genitourinary   Musculoskeletal   Abdominal   Peds  Hematology  (+) Blood dyscrasia, anemia ,   Anesthesia Other Findings   Reproductive/Obstetrics negative OB ROS                             Anesthesia Physical Anesthesia Plan  ASA: 2  Anesthesia Plan: General   Post-op Pain Management:    Induction:   PONV Risk Score and Plan: Propofol infusion  Airway Management Planned:   Additional Equipment:   Intra-op Plan:   Post-operative Plan:   Informed Consent: I have reviewed the patients History and Physical, chart, labs and discussed the procedure including the risks, benefits and alternatives for the proposed anesthesia with the patient or authorized representative who has indicated his/her understanding and acceptance.     Dental Advisory Given  Plan Discussed with: CRNA  Anesthesia Plan Comments:         Anesthesia Quick Evaluation

## 2021-10-09 NOTE — Transfer of Care (Signed)
Immediate Anesthesia Transfer of Care Note  Patient: Pam Lopez  Procedure(s) Performed: COLONOSCOPY WITH PROPOFOL ESOPHAGOGASTRODUODENOSCOPY (EGD) WITH PROPOFOL MALONEY DILATION BIOPSY POLYPECTOMY  Patient Location: PACU  Anesthesia Type:General  Level of Consciousness: awake and patient cooperative  Airway & Oxygen Therapy: Patient Spontanous Breathing  Post-op Assessment: Report given to RN and Post -op Vital signs reviewed and stable  Post vital signs: Reviewed and stable  Last Vitals:  Vitals Value Taken Time  BP 105/66 10/09/21 1405  Temp 36.4 C 10/09/21 1405  Pulse 72 10/09/21 1405  Resp 17 10/09/21 1405  SpO2 98 % 10/09/21 1405    Last Pain:  Vitals:   10/09/21 1405  TempSrc: Axillary  PainSc: 0-No pain      Patients Stated Pain Goal: 6 (15/52/08 0223)  Complications: No notable events documented.

## 2021-10-09 NOTE — H&P (Signed)
$'@LOGO'o$ @   Primary Care Physician:  Glenda Chroman, MD Primary Gastroenterologist:  Dr. Gala Romney  Pre-Procedure History & Physical: HPI:  Pam Lopez is a 66 y.o. female here for further evaluation of dysphagia and anemia.  She is here for an EGD with possible esophageal dilation as well as a surveillance colonoscopy/diagnostic new anemia and a history of colonic adenoma.  Past Medical History:  Diagnosis Date   Dyslipidemia    GERD (gastroesophageal reflux disease)    HTN (hypertension)    not on medication   Hypertension    IBS (irritable bowel syndrome)    IDA (iron deficiency anemia)    Panic disorder     Past Surgical History:  Procedure Laterality Date   BREAST SURGERY     biopsy   COLONOSCOPY  04/29/99   HBZ:JIRCVEL internal hemorrhoids/normal colon,rectum,and terinal ileum   COLONOSCOPY N/A 02/15/2013   FYB:OFBPZWC polyp-removed as described above. Segmental biopsy performed. Colon polyp-tubular adenoma. Random bx negative    Prior to Admission medications   Medication Sig Start Date End Date Taking? Authorizing Provider  albuterol (VENTOLIN HFA) 108 (90 Base) MCG/ACT inhaler Inhale 1 puff into the lungs every 6 (six) hours as needed for wheezing or shortness of breath.   Yes [provider]  aspirin EC 81 MG tablet Take 81 mg by mouth daily. Swallow whole.   Yes [provider]  lisinopril-hydrochlorothiazide (ZESTORETIC) 10-12.5 MG tablet Take 1 tablet by mouth daily.   Yes [provider]  loratadine (CLARITIN) 10 MG tablet Take 10 mg by mouth daily.   Yes [provider]  omeprazole (PRILOSEC) 20 MG capsule TAKE (1) CAPSULE BY MOUTH ONCE DAILY. 05/13/14  Yes Annitta Needs, NP  pravastatin (PRAVACHOL) 40 MG tablet Take 40 mg by mouth daily.   Yes [provider]  Vitamin D, Ergocalciferol, (DRISDOL) 1.25 MG (50000 UNIT) CAPS capsule Take 50,000 Units by mouth every 7 (seven) days.   Yes [provider]    Allergies  as of 09/07/2021   (No Known Allergies)    Family History  Problem Relation Age of Onset   Cancer Mother        stomach   Cancer Father        lung   Hypertension Father    Cancer Sister        breast   Cancer Sister        female   Colon cancer Neg Hx     Social History   Socioeconomic History   Marital status: Single    Spouse name: Not on file   Number of children: Not on file   Years of education: Not on file   Highest education level: Not on file  Occupational History   Occupation: Science writer: [employer]  Tobacco Use   Smoking status: Every Day    Packs/day: 0.50    Years: 40.00    Total pack years: 20.00    Types: Cigarettes   Smokeless tobacco: Not on file  Vaping Use   Vaping Use: Never used  Substance and Sexual Activity   Alcohol use: Yes    Comment: occ   Drug use: No   Sexual activity: Not Currently  Other Topics Concern   Not on file  Social History Narrative   Not on file   Social Determinants of Health   Financial Resource Strain: Not on file  Food Insecurity: Not on file  Transportation Needs: Not on file  Physical Activity: Not on file  Stress: Not on file  Social Connections: Not on file  Intimate Partner Violence: Not on file    Review of Systems: See HPI, otherwise negative ROS  Physical Exam: BP 123/67   Pulse 74   Temp 98.9 F (37.2 C) (Oral)   Resp 18   Ht '5\' 2"'$  (1.575 m)   Wt 48.5 kg   SpO2 100%   BMI 19.56 kg/m  General:   Alert,  Well-developed, well-nourished, pleasant and cooperative in NAD Neck:  Supple; no masses or thyromegaly. No significant cervical adenopathy. Lungs:  Clear throughout to auscultation.   No wheezes, crackles, or rhonchi. No acute distress. Heart:  Regular rate and rhythm; no murmurs, clicks, rubs,  or gallops. Abdomen: Non-distended, normal bowel sounds.  Soft and nontender without appreciable mass or hepatosplenomegaly.  Pulses:  Normal pulses noted. Extremities:  Without  clubbing or edema.  Impression/Plan: 67 year old lady with dysphagia and history of colonic adenoma.  She now has anemia.  She is here for an EGD with possible esophageal dilation as feasible/appropriate along with a surveillance/diagnostic colonoscopy. The risks, benefits, limitations, imponderables and alternatives regarding both EGD and colonoscopy have been reviewed with the patient. Questions have been answered. All parties agreeable.       Notice: This dictation was prepared with Dragon dictation along with smaller phrase technology. Any transcriptional errors that result from this process are unintentional and may not be corrected upon review.

## 2021-10-09 NOTE — Op Note (Signed)
[facility] Patient Name: Pam Lopez Procedure Date: 10/09/2021 1:22 PM MRN: [id number] Date of Birth: Nov 09, 1955 Attending MD: Norvel Richards , MD CSN: [id number] Age: 66 Admit Type: Outpatient Procedure:                Upper GI endoscopy Indications:              Iron deficiency anemia secondary to chronic blood                            loss, Dysphagia Providers:                Norvel Richards, MD, Caprice Kluver, Rosina Lowenstein,                            RN, Ladoris Gene Technician, Technician Referring MD:              Medicines:                Propofol per Anesthesia Complications:            No immediate complications. Estimated Blood Loss:     Estimated blood loss was minimal. Procedure:                Pre-Anesthesia Assessment:                           - Prior to the procedure, a History and Physical                            was performed, and patient medications and                            allergies were reviewed. The patient's tolerance of                            previous anesthesia was also reviewed. The risks                            and benefits of the procedure and the sedation                            options and risks were discussed with the patient.                            All questions were answered, and informed consent                            was obtained. Prior Anticoagulants: The patient has                            taken no previous anticoagulant or antiplatelet                            agents. ASA Grade Assessment: III - A patient with  severe systemic disease. After reviewing the risks                            and benefits, the patient was deemed in                            satisfactory condition to undergo the procedure.                           After obtaining informed consent, the endoscope was                            passed under direct vision. Throughout the                             procedure, the patient's blood pressure, pulse, and                            oxygen saturations were monitored continuously. The                            GIF-H190 ([id number]) scope was introduced through the                            mouth, and advanced to the second part of duodenum.                            The upper GI endoscopy was accomplished without                            difficulty. The patient tolerated the procedure                            well. The patient tolerated the procedure well. Scope In: 1:35:13 PM Scope Out: 1:42:46 PM Total Procedure Duration: 0 hours 7 minutes 33 seconds  Findings:      The examined esophagus was normal.      A small hiatal hernia was present. Elliptical and linear scar versus       healing ulcer prepyloric antral mucosa. No obvious infiltrating process.       Pylorus patent. This was biopsied with a cold forceps for histology.       Estimated blood loss was minimal.      The duodenal bulb and second portion of the duodenum were normal. The       scope was withdrawn. Dilation was performed with a Maloney dilator with       no resistance at 23 Fr. The scope was withdrawn. Dilation was performed       with a Maloney dilator with mild resistance at 56 Fr. The dilation site       was examined following endoscope reinsertion and showed no change.       Estimated blood loss: none. Impression:               - Normal esophagus. Dilated.                           -  Small hiatal hernia. Prepyloric mucosal scar                            versus healing ulcer. Biopsied.                           - Normal duodenal bulb and second portion of the                            duodenum. Moderate Sedation:      Moderate (conscious) sedation was personally administered by an       anesthesia professional. The following parameters were monitored: oxygen       saturation, heart rate, blood pressure, respiratory rate, EKG, adequacy       of pulmonary  ventilation, and response to care. Recommendation:           - Patient has a contact number available for                            emergencies. The signs and symptoms of potential                            delayed complications were discussed with the                            patient. Return to normal activities tomorrow.                            Written discharge instructions were provided to the                            patient.                           - Resume previous diet.                           - Continue present medications.                           - Await pathology results.                           - Return to my office in 3 months. See colonoscopy                            report Procedure Code(s):        --- Professional ---                           [phone], Esophagogastroduodenoscopy, flexible,                            transoral; with biopsy, single or multiple                           43450, Dilation of esophagus, by unguided sound  or                            bougie, single or multiple passes Diagnosis Code(s):        --- Professional ---                           K44.9, Diaphragmatic hernia without obstruction or                            gangrene                           D50.0, Iron deficiency anemia secondary to blood                            loss (chronic)                           R13.10, Dysphagia, unspecified CPT copyright 2019 [employer]. All rights reserved. The codes documented in this report are preliminary and upon coder review may  be revised to meet current compliance requirements. Cristopher Estimable. Charisma Charlot, MD Norvel Richards, MD 10/09/2021 2:04:25 PM This report has been signed electronically. Number of Addenda: 0

## 2021-10-10 NOTE — Anesthesia Postprocedure Evaluation (Signed)
Anesthesia Post Note  Patient: Pam Lopez  Procedure(s) Performed: COLONOSCOPY WITH PROPOFOL ESOPHAGOGASTRODUODENOSCOPY (EGD) WITH PROPOFOL [place] POLYPECTOMY  Patient location during evaluation: Phase II Anesthesia Type: General Level of consciousness: awake Pain management: pain level controlled Vital Signs Assessment: post-procedure vital signs reviewed and stable Respiratory status: spontaneous breathing and respiratory function stable Cardiovascular status: blood pressure returned to baseline and stable Postop Assessment: no headache and no apparent nausea or vomiting Anesthetic complications: no Comments: Late entry   No notable events documented.   Last Vitals:  Vitals:   10/09/21 1233 10/09/21 1405  BP: 123/67 105/66  Pulse: 74 72  Resp: 18 17  Temp: 37.2 C 36.4 C  SpO2: 100% 98%    Last Pain:  Vitals:   10/09/21 1405  TempSrc: Axillary  PainSc: 0-No pain                 Louann Sjogren

## 2021-10-13 LAB — SURGICAL PATHOLOGY

## 2021-10-18 ENCOUNTER — Encounter: Payer: Self-pay | Admitting: Internal Medicine

## 2021-10-19 ENCOUNTER — Encounter (HOSPITAL_COMMUNITY): Payer: Self-pay | Admitting: Internal Medicine

## 2021-11-19 ENCOUNTER — Other Ambulatory Visit: Payer: Self-pay | Admitting: Internal Medicine

## 2021-11-19 DIAGNOSIS — Z1231 Encounter for screening mammogram for malignant neoplasm of breast: Secondary | ICD-10-CM

## 2021-11-25 ENCOUNTER — Inpatient Hospital Stay: Admission: RE | Admit: 2021-11-25 | Payer: BC Managed Care – PPO | Source: Ambulatory Visit

## 2022-02-03 ENCOUNTER — Encounter: Payer: Self-pay | Admitting: Internal Medicine

## 2022-10-15 ENCOUNTER — Ambulatory Visit
Admission: RE | Admit: 2022-10-15 | Discharge: 2022-10-15 | Disposition: A | Discharge status: Home / Self Care | Payer: BC Managed Care – PPO | Source: Ambulatory Visit | Attending: Internal Medicine | Admitting: Internal Medicine

## 2022-10-15 DIAGNOSIS — Z1231 Encounter for screening mammogram for malignant neoplasm of breast: Secondary | ICD-10-CM

## 2023-10-13 ENCOUNTER — Other Ambulatory Visit: Payer: Self-pay | Admitting: Internal Medicine

## 2023-10-13 DIAGNOSIS — Z1231 Encounter for screening mammogram for malignant neoplasm of breast: Secondary | ICD-10-CM

## 2023-10-25 ENCOUNTER — Ambulatory Visit
Admission: RE | Admit: 2023-10-25 | Discharge: 2023-10-25 | Disposition: A | Source: Ambulatory Visit | Attending: Internal Medicine | Admitting: Internal Medicine

## 2023-10-25 DIAGNOSIS — Z1231 Encounter for screening mammogram for malignant neoplasm of breast: Secondary | ICD-10-CM

## 2023-12-22 ENCOUNTER — Institutional Professional Consult (permissible substitution) (INDEPENDENT_AMBULATORY_CARE_PROVIDER_SITE_OTHER): Admitting: Otolaryngology

## 2024-02-23 ENCOUNTER — Institutional Professional Consult (permissible substitution) (INDEPENDENT_AMBULATORY_CARE_PROVIDER_SITE_OTHER): Admitting: Otolaryngology
